# Patient Record
Sex: Female | Born: 1955 | Race: White | Hispanic: No | Smoking: Never smoker
Health system: Southern US, Community
[De-identification: ages and names within clinical notes are randomized; demographics above are authoritative.]

## PROBLEM LIST (undated history)

## (undated) DIAGNOSIS — F329 Major depressive disorder, single episode, unspecified: Secondary | ICD-10-CM

## (undated) DIAGNOSIS — K219 Gastro-esophageal reflux disease without esophagitis: Secondary | ICD-10-CM

## (undated) DIAGNOSIS — G4700 Insomnia, unspecified: Secondary | ICD-10-CM

## (undated) DIAGNOSIS — J329 Chronic sinusitis, unspecified: Secondary | ICD-10-CM

## (undated) DIAGNOSIS — F32A Depression, unspecified: Secondary | ICD-10-CM

## (undated) DIAGNOSIS — F419 Anxiety disorder, unspecified: Secondary | ICD-10-CM

## (undated) HISTORY — PX: KNEE ARTHROSCOPY: SUR90

## (undated) HISTORY — DX: Major depressive disorder, single episode, unspecified: F32.9

## (undated) HISTORY — DX: Gastro-esophageal reflux disease without esophagitis: K21.9

## (undated) HISTORY — DX: Anxiety disorder, unspecified: F41.9

## (undated) HISTORY — DX: Insomnia, unspecified: G47.00

## (undated) HISTORY — DX: Chronic sinusitis, unspecified: J32.9

## (undated) HISTORY — DX: Depression, unspecified: F32.A

---

## 2001-02-03 DIAGNOSIS — J329 Chronic sinusitis, unspecified: Secondary | ICD-10-CM

## 2001-02-03 HISTORY — DX: Chronic sinusitis, unspecified: J32.9

## 2006-03-20 ENCOUNTER — Ambulatory Visit: Payer: Self-pay | Admitting: Internal Medicine

## 2006-08-20 ENCOUNTER — Ambulatory Visit: Payer: Self-pay | Admitting: Internal Medicine

## 2009-02-03 HISTORY — PX: JOINT REPLACEMENT: SHX530

## 2009-03-06 HISTORY — PX: REPLACEMENT TOTAL KNEE: SUR1224

## 2009-05-18 ENCOUNTER — Ambulatory Visit: Payer: Self-pay | Admitting: Unknown Physician Specialty

## 2009-06-07 ENCOUNTER — Ambulatory Visit: Payer: Self-pay | Admitting: Internal Medicine

## 2010-04-08 ENCOUNTER — Ambulatory Visit: Payer: Self-pay | Admitting: Internal Medicine

## 2010-04-11 ENCOUNTER — Ambulatory Visit: Payer: Self-pay | Admitting: Internal Medicine

## 2010-10-01 ENCOUNTER — Encounter: Payer: Self-pay | Admitting: Internal Medicine

## 2010-10-01 ENCOUNTER — Ambulatory Visit (INDEPENDENT_AMBULATORY_CARE_PROVIDER_SITE_OTHER): Payer: PRIVATE HEALTH INSURANCE | Admitting: Internal Medicine

## 2010-10-01 VITALS — BP 120/83 | HR 80 | Temp 98.8°F | Resp 16 | Ht 63.0 in | Wt 185.0 lb

## 2010-10-01 DIAGNOSIS — M25579 Pain in unspecified ankle and joints of unspecified foot: Secondary | ICD-10-CM

## 2010-10-01 DIAGNOSIS — S96911A Strain of unspecified muscle and tendon at ankle and foot level, right foot, initial encounter: Secondary | ICD-10-CM

## 2010-10-01 DIAGNOSIS — S93409A Sprain of unspecified ligament of unspecified ankle, initial encounter: Secondary | ICD-10-CM

## 2010-10-01 MED ORDER — CELECOXIB 200 MG PO CAPS: 200.0000 mg | ORAL_CAPSULE | Freq: Every day | ORAL | Status: DC

## 2010-10-01 NOTE — Assessment & Plan Note (Addendum)
There is no history of trauma, so ligament sprain vs stress fracture are the most likely cauaes. Empiric treatment with NSAID.  celebrex samples given.   Referral to PT [facility] recommended as gthere is the concern that her ankles are strained secoddanry to alignemnt of leg following bialteral TKRs

## 2010-10-01 NOTE — Progress Notes (Signed)
  Subjective:    Patient ID: Michelle Ross, female    DOB: 11-10-1955, 55 y.o.   MRN: [id number]  HPI 55 yo Hispanic female with history of DJD s/p bilateral knee replacments presents with persistent bilateral knee pain and new onset right ankdle/dorsal foot pain status post surgery.  Pain in top of foot is aggravated by weight bearing .  NO history of falling or twisting.  No real treatment thus far.    Review of Systems  Constitutional: Negative for fever, chills and unexpected weight change.  HENT: Negative for hearing loss, ear pain, nosebleeds, congestion, sore throat, facial swelling, rhinorrhea, sneezing, mouth sores, trouble swallowing, neck pain, neck stiffness, voice change, postnasal drip, sinus pressure, tinnitus and ear discharge.   Eyes: Negative for pain, discharge, redness and visual disturbance.  Respiratory: Negative for cough, chest tightness, shortness of breath, wheezing and stridor.   Cardiovascular: Negative for chest pain, palpitations and leg swelling.  Musculoskeletal: Positive for back pain, joint swelling, arthralgias and gait problem. Negative for myalgias.  Skin: Negative for color change and rash.  Neurological: Negative for dizziness, weakness, light-headedness and headaches.  Hematological: Negative for adenopathy.       Objective:   Physical Exam  Constitutional: She is oriented to person, place, and time. She appears well-developed and well-nourished.  HENT:  Mouth/Throat: Oropharynx is clear and moist.  Eyes: EOM are normal. Pupils are equal, round, and reactive to light. No scleral icterus.  Neck: Normal range of motion. Neck supple. No JVD present. No thyromegaly present.  Cardiovascular: Normal rate, regular rhythm, normal heart sounds and intact distal pulses.   Pulmonary/Chest: Effort normal and breath sounds normal.  Abdominal: Soft. Bowel sounds are normal. She exhibits no mass. There is no tenderness.  Musculoskeletal: Normal range of motion.     Pain over the anterior ankle/dorsum of foot   Lymphadenopathy:    She has no cervical adenopathy.  Neurological: She is alert and oriented to person, place, and time.  Skin: Skin is warm and dry.  Psychiatric: She has a normal mood and affect.          Assessment & Plan:  Venous insufficiency: secodanty to surgery.  Compression stockings recommened.

## 2010-10-01 NOTE — Patient Instructions (Signed)
You can purchase knee high, thigh high or complete compression stockings to manage lower extremity swelling from a company called [employer] . [employer]  Go online and order their cataologue.  They are based on you shoe size and how strong you want them.    You can try Beano drops before a meal that causres gas or try Gas X or Mylanta Gas for bloating and gas after a meal.  They are available without a prescription.   We are going to try Celebrex 200 mg daily for inflammation.  Ice your ankle for 15 minutes after exercise and avoid high heeled shoes.

## 2010-10-16 ENCOUNTER — Encounter: Payer: Self-pay | Admitting: Internal Medicine

## 2011-02-10 ENCOUNTER — Other Ambulatory Visit: Payer: Self-pay | Admitting: *Deleted

## 2011-02-10 MED ORDER — HYDROCODONE-ACETAMINOPHEN 5-500 MG PO TABS: 1.0000 | ORAL_TABLET | Freq: Four times a day (QID) | ORAL | Status: AC | PRN

## 2011-02-10 NOTE — Telephone Encounter (Signed)
Ok to refill vicodin #30 1 refill

## 2011-02-10 NOTE — Telephone Encounter (Signed)
Faxed request from [facility], last filled 04/11/10.

## 2011-02-10 NOTE — Telephone Encounter (Signed)
Medicine called to pharmacy. 

## 2011-03-19 ENCOUNTER — Other Ambulatory Visit: Payer: Self-pay | Admitting: *Deleted

## 2011-03-19 MED ORDER — CITALOPRAM HYDROBROMIDE 40 MG PO TABS: 40.0000 mg | ORAL_TABLET | Freq: Every day | ORAL | Status: DC

## 2011-03-19 NOTE — Telephone Encounter (Signed)
Pt is asking that [facility] [place].

## 2011-03-21 ENCOUNTER — Ambulatory Visit (INDEPENDENT_AMBULATORY_CARE_PROVIDER_SITE_OTHER): Payer: PRIVATE HEALTH INSURANCE | Admitting: Internal Medicine

## 2011-03-21 ENCOUNTER — Telehealth: Payer: Self-pay | Admitting: *Deleted

## 2011-03-21 ENCOUNTER — Encounter: Payer: Self-pay | Admitting: Internal Medicine

## 2011-03-21 VITALS — BP 132/92 | HR 92 | Temp 98.2°F | Wt 194.0 lb

## 2011-03-21 DIAGNOSIS — J329 Chronic sinusitis, unspecified: Secondary | ICD-10-CM | POA: Insufficient documentation

## 2011-03-21 DIAGNOSIS — J383 Other diseases of vocal cords: Secondary | ICD-10-CM | POA: Insufficient documentation

## 2011-03-21 DIAGNOSIS — R030 Elevated blood-pressure reading, without diagnosis of hypertension: Secondary | ICD-10-CM

## 2011-03-21 DIAGNOSIS — E669 Obesity, unspecified: Secondary | ICD-10-CM

## 2011-03-21 DIAGNOSIS — R635 Abnormal weight gain: Secondary | ICD-10-CM

## 2011-03-21 MED ORDER — HYDROCODONE-ACETAMINOPHEN 5-500 MG PO TABS: 1.0000 | ORAL_TABLET | Freq: Four times a day (QID) | ORAL | Status: DC | PRN

## 2011-03-21 MED ORDER — AMOXICILLIN-POT CLAVULANATE 875-125 MG PO TABS: 1.0000 | ORAL_TABLET | Freq: Two times a day (BID) | ORAL | Status: AC

## 2011-03-21 NOTE — Telephone Encounter (Signed)
Pt is asking for work note for today.  She will pick up on Monday.

## 2011-03-21 NOTE — Progress Notes (Signed)
Subjective:    Patient ID: Michelle Ross, female    DOB: [birth year], 56 y.o.   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 For the last week she has been experiencing cough and sinus congestion and feels chest tightness at night when she lies down accompanied by a feeling of pressure and dyspnea. She has no history of  asthma, coronary artery disease or hyperlipidemia.  She does not smoke. She is not exercising regularly.  Past Medical History  Diagnosis Date  . GERD (gastroesophageal reflux disease)   . Sinusitis 2003    History  . Insomnia   . Depression   . Anxiety    Current Outpatient Prescriptions on File Prior to Visit  Medication Sig Dispense Refill  . citalopram (CELEXA) 40 MG tablet Take 1 tablet (40 mg total) by mouth daily.  30 tablet  5  . celecoxib (CELEBREX) 200 MG capsule Take 1 capsule (200 mg total) by mouth daily.  12 capsule  0     Review of Systems  Constitutional: Negative for fever, chills and unexpected weight change.  HENT: Positive for congestion and postnasal drip. Negative for hearing loss, ear pain, nosebleeds, sore throat, facial swelling, rhinorrhea, sneezing, mouth sores, trouble swallowing, neck pain, neck stiffness, voice change, sinus pressure, tinnitus and ear discharge.   Eyes: Negative for pain, discharge, redness and visual disturbance.  Respiratory: Positive for cough, chest tightness and wheezing. Negative for shortness of breath and stridor.   Cardiovascular: Negative for chest pain, palpitations and leg swelling.  Musculoskeletal: Negative for myalgias and arthralgias.  Skin: Negative for color change and rash.  Neurological: Negative for dizziness, weakness, light-headedness and headaches.  Hematological: Negative for adenopathy.   BP  132/92  Pulse 92  Temp(Src) 98.2 F (36.8 C) (Oral)  Wt 194 lb (87.998 kg)  SpO2 98%     Objective:   Physical Exam  Vitals reviewed. Constitutional: She is oriented to person, place, and time. She appears well-developed and well-nourished.  HENT:  Mouth/Throat: Oropharynx is clear and moist.  Eyes: EOM are normal. Pupils are equal, round, and reactive to light. No scleral icterus.  Neck: Normal range of motion. Neck supple. No JVD present. No thyromegaly present.  Cardiovascular: Normal rate, regular rhythm, normal heart sounds and intact distal pulses.   Pulmonary/Chest: Effort normal and breath sounds normal.  Abdominal: Soft. Bowel sounds are normal. She exhibits no mass. There is no tenderness.  Musculoskeletal: Normal range of motion. She exhibits no edema.  Lymphadenopathy:    She has no cervical adenopathy.  Neurological: She is alert and oriented to person, place, and time.  Skin: Skin is warm and dry.  Psychiatric: She has a normal mood and affect.          Assessment & Plan:   Sinusitis She is not wheezing on exam the sound she was making when she came in is explained to her with vocal cord dysfunction not wheezing. I will treat her for a sinus infection however with antibiotics and decongestants. If her symptoms continue or shortness of breath after her current upper respiratory infection is is resolved we will send her for pulmonary function testing cardiac stress testing  Obesity (BMI 30.0-34.9) She has gained considerable weight in the last 18 months. The ureters are her recent joint replacements which  have interrupted her prior history of running. Additionally she is unhappy at work and has very limited social outlets. I have recommended that she begin a low glycemic index diet and started her exercise regimen with a goal towards 30 minutes a day for 5 days per week. I have asked her to make a goal of losing 4 pounds a month and return to me in 3 months. We will  screen her for hyperlipidemia and diabetes when she can return for fasting lipids.  Elevated blood pressure reading without diagnosis of hypertension She has diastolic elevation today which is new for her. When she returns in 3 months if her pressure remains elevated we will begin medications.    Updated Medication List Outpatient Encounter Prescriptions as of 03/21/2011  Medication Sig Dispense Refill  . citalopram (CELEXA) 40 MG tablet Take 1 tablet (40 mg total) by mouth daily.  30 tablet  5  . HYDROcodone-acetaminophen (VICODIN) 5-500 MG per tablet Take 1 tablet by mouth every 6 (six) hours as needed for pain (or cough). Take one by mouth as needed.  30 tablet  2  . DISCONTD: HYDROcodone-acetaminophen (VICODIN) 5-500 MG per tablet Take one by mouth as needed.      Marland Kitchen amoxicillin-clavulanate (AUGMENTIN) 875-125 MG per tablet Take 1 tablet by mouth 2 (two) times daily.  14 tablet  0  . celecoxib (CELEBREX) 200 MG capsule Take 1 capsule (200 mg total) by mouth daily.  12 capsule  0

## 2011-03-21 NOTE — Patient Instructions (Signed)
Take augmentin for the sinus infection   Use Delsym for daytime cough,  vicodin for nighttime cough  Sudafed PE  Take 20 mg every 6 hours for congestion .   Use benadryl if needed for post nasal drainage

## 2011-03-21 NOTE — Telephone Encounter (Signed)
On printer

## 2011-03-21 NOTE — Telephone Encounter (Signed)
Note placed up front for patient to pick up

## 2011-03-23 ENCOUNTER — Encounter: Payer: Self-pay | Admitting: Internal Medicine

## 2011-03-23 DIAGNOSIS — R030 Elevated blood-pressure reading, without diagnosis of hypertension: Secondary | ICD-10-CM | POA: Insufficient documentation

## 2011-03-23 DIAGNOSIS — E669 Obesity, unspecified: Secondary | ICD-10-CM | POA: Insufficient documentation

## 2011-03-23 NOTE — Assessment & Plan Note (Signed)
She has gained considerable weight in the last 18 months. The ureters are her recent joint replacements which have interrupted her prior history of running. Additionally she is unhappy at work and has very limited social outlets. I have recommended that she begin a low glycemic index diet and started her exercise regimen with a goal towards 30 minutes a day for 5 days per week. I have asked her to make a goal of losing 4 pounds a month and return to me in 3 months. We will screen her for hyperlipidemia and diabetes when she can return for fasting lipids.

## 2011-03-23 NOTE — Assessment & Plan Note (Signed)
She has diastolic elevation today which is new for her. When she returns in 3 months if her pressure remains elevated we will begin medications.

## 2011-03-23 NOTE — Assessment & Plan Note (Signed)
She is not wheezing on exam the sound she was making when she came in is explained to her with vocal cord dysfunction not wheezing. I will treat her for a sinus infection however with antibiotics and decongestants. If her symptoms continue or shortness of breath after her current upper respiratory infection is is resolved we will send her for pulmonary function testing cardiac stress testing

## 2011-03-25 ENCOUNTER — Other Ambulatory Visit: Payer: PRIVATE HEALTH INSURANCE

## 2011-03-25 LAB — COMPREHENSIVE METABOLIC PANEL
Albumin: 4.1 g/dL (ref 3.5–5.2)
BUN: 13 mg/dL (ref 6–23)
CO2: 22 mEq/L (ref 19–32)
Calcium: 9.2 mg/dL (ref 8.4–10.5)
Chloride: 106 mEq/L (ref 96–112)
Glucose, Bld: 92 mg/dL (ref 70–99)
Potassium: 4 mEq/L (ref 3.5–5.1)

## 2011-03-25 LAB — LIPID PANEL
Cholesterol: 245 mg/dL — ABNORMAL HIGH (ref 0–200)
Triglycerides: 115 mg/dL (ref 0.0–149.0)

## 2011-03-25 LAB — LDL CHOLESTEROL, DIRECT: Direct LDL: 148.8 mg/dL

## 2011-03-25 NOTE — Progress Notes (Signed)
Quick Note:  All labs were normal, no signs of diabetes ______

## 2011-06-20 ENCOUNTER — Ambulatory Visit: Payer: PRIVATE HEALTH INSURANCE | Admitting: Internal Medicine

## 2011-07-04 ENCOUNTER — Ambulatory Visit (INDEPENDENT_AMBULATORY_CARE_PROVIDER_SITE_OTHER): Payer: PRIVATE HEALTH INSURANCE | Admitting: Internal Medicine

## 2011-07-04 ENCOUNTER — Encounter: Payer: Self-pay | Admitting: Internal Medicine

## 2011-07-04 VITALS — BP 122/70 | HR 78 | Temp 97.6°F | Resp 18 | Wt 187.5 lb

## 2011-07-04 DIAGNOSIS — R635 Abnormal weight gain: Secondary | ICD-10-CM

## 2011-07-04 DIAGNOSIS — E669 Obesity, unspecified: Secondary | ICD-10-CM

## 2011-07-04 MED ORDER — PHENTERMINE HCL 37.5 MG PO TABS: 37.5000 mg | ORAL_TABLET | Freq: Every day | ORAL | Status: DC

## 2011-07-04 MED ORDER — CITALOPRAM HYDROBROMIDE 40 MG PO TABS: 40.0000 mg | ORAL_TABLET | Freq: Every day | ORAL | Status: DC

## 2011-07-04 MED ORDER — PHENTERMINE HCL 37.5 MG PO TABS: 18.7500 mg | ORAL_TABLET | Freq: Every day | ORAL | Status: DC

## 2011-07-04 MED ORDER — SPIRONOLACTONE 25 MG PO TABS: 25.0000 mg | ORAL_TABLET | Freq: Every day | ORAL | Status: DC

## 2011-07-04 MED ORDER — HYDROCODONE-ACETAMINOPHEN 5-500 MG PO TABS: 1.0000 | ORAL_TABLET | Freq: Four times a day (QID) | ORAL | Status: DC | PRN

## 2011-07-04 NOTE — Patient Instructions (Signed)
We will try adding a diuretic for the fluid retention.  Spironolactone 25 mg daily    Consider the Low Glycemic Index Diet and 6 smaller meals daily .  This boosts your metabolism and regulates your sugars:   7 AM Low carbohydrate Protein  Shakes (EAS Carb Control  Or Atkins ,  Available everywhere,   In  cases at [employer] )  2.5 carbs  (Add or substitute a toasted sandwhich thin w/ peanut butter)  10 AM: Protein bar by Atkins (snack size,  Chocolate lover's variety at  [employer])    Lunch: sandwich on pita bread or flatbread (Joseph's makes a pita bread and a flat bread , available at [employer] and [employer]; Toufayah makes a low carb flatbread available at [employer] and [employer]) Mission makes a low carb whole wheat tortilla available at [employer] most grocery stores   3 PM:  Mid day :  Another protein bar,  Or a  cheese stick, 1/4 cup of almonds, walnuts, pistachios, pecans, peanuts,  Macadamia nuts  6 PM  Dinner:  "mean and green:"  Meat/chicken/fish, salad, and green veggie : use ranch, vinagrette,  Blue cheese, etc  9 PM snack : Breyer's low carb fudgsicle or  ice cream bar (Carb Smart), or  Weight Watcher's ice cream bar , or another protein shake

## 2011-07-06 NOTE — Progress Notes (Signed)
Patient ID: Michelle Ross, female   DOB: [birth year], 56 y.o.   MRN: [id number]  Patient Active Problem List  Diagnoses  . Pain in joint involving ankle and foot  . Weight gain  . Sinusitis  . Vocal cord dysfunction  . Obesity (BMI 30.0-34.9)  . Elevated blood pressure reading without diagnosis of hypertension    Subjective:  CC:   Chief Complaint  Patient presents with  . Follow-up    3 month    HPI:   Michelle Ross a 56 y.o. female who presents ollow up on weight gain.  She has lost 6 lbs since her lalst visit 2 months ago, but is frustrated at her inability to lose more despite regular exercise and a healthy diet that is low in starches.  She exercises daily for nearly an hour with a variety of activities and feels she has gained muscle mass but is still concerned that the weight gain is due partly to the weight of the hardware of both knees.     Past Medical History  Diagnosis Date  . GERD (gastroesophageal reflux disease)   . Sinusitis 2003    History  . Insomnia   . Depression   . Anxiety     Past Surgical History  Procedure Date  . Knee arthroscopy   . Replacement total knee Feb. 2011    Right Knee  . Joint replacement 2011    bilateral knee         The following portions of the patient's history were reviewed and updated as appropriate: Allergies, current medications, and problem list.    Review of Systems:   12 Pt  review of systems was negative except those addressed in the HPI.      History   Social History  . Marital Status: Divorced    Spouse Name: N/A    Number of Children: N/A  . Years of Education: N/A   Occupational History  . Intrepretor     Full-Time at [employer]   Social History Main Topics  . Smoking status: Never Smoker   . Smokeless tobacco: Never Used  . Alcohol Use: 3.0 oz/week    5 Glasses of wine per week     occasional  . Drug Use: No  . Sexually Active: Not on file   Other Topics Concern  . Not on  file   Social History Narrative   Exercises regularly    Objective:  BP 122/70  Pulse 78  Temp(Src) 97.6 F (36.4 C) (Oral)  Resp 18  Wt 187 lb 8 oz (85.049 kg)  SpO2 98%  General appearance: alert, cooperative and appears stated age Ears: normal TM's and external ear canals both ears Throat: lips, mucosa, and tongue normal; teeth and gums normal Neck: no adenopathy, no carotid bruit, supple, symmetrical, trachea midline and thyroid not enlarged, symmetric, no tenderness/mass/nodules Back: symmetric, no curvature. ROM normal. No CVA tenderness. Lungs: clear to auscultation bilaterally Heart: regular rate and rhythm, S1, S2 normal, no murmur, click, rub or gallop Abdomen: soft, non-tender; bowel sounds normal; no masses,  no organomegaly Pulses: 2+ and symmetric Skin: Skin color, texture, turgor normal. No rashes or lesions Lymph nodes: Cervical, supraclavicular, and axillary nodes normal.  Assessment and Plan:  Weight gain Encouragement given.  I have addressed  BMI and recommended a low glycemic index diet utilizing smaller more frequent meals to increase metabolism.    Spironolactone added for management of edema.     Updated Medication List Outpatient  Encounter Prescriptions as of 07/04/2011  Medication Sig Dispense Refill  . citalopram (CELEXA) 40 MG tablet Take 1 tablet (40 mg total) by mouth daily.  30 tablet  5  . HYDROcodone-acetaminophen (VICODIN) 5-500 MG per tablet Take 1 tablet by mouth every 6 (six) hours as needed for pain (or cough). Take one by mouth as needed.  30 tablet  2  . DISCONTD: citalopram (CELEXA) 40 MG tablet Take 1 tablet (40 mg total) by mouth daily.  30 tablet  5  . DISCONTD: HYDROcodone-acetaminophen (VICODIN) 5-500 MG per tablet Take 1 tablet by mouth every 6 (six) hours as needed for pain (or cough). Take one by mouth as needed.  30 tablet  2  . phentermine (ADIPEX-P) 37.5 MG tablet Take 0.5 tablets (18.75 mg total) by mouth daily before  breakfast.  15 tablet  2  . spironolactone (ALDACTONE) 25 MG tablet Take 1 tablet (25 mg total) by mouth daily.  30 tablet  1  . DISCONTD: celecoxib (CELEBREX) 200 MG capsule Take 1 capsule (200 mg total) by mouth daily.  12 capsule  0  . DISCONTD: phentermine (ADIPEX-P) 37.5 MG tablet Take 1 tablet (37.5 mg total) by mouth daily before breakfast.  30 tablet  2  . DISCONTD: phentermine (ADIPEX-P) 37.5 MG tablet Take 1 tablet (37.5 mg total) by mouth daily before breakfast.  15 tablet  2     No orders of the defined types were placed in this encounter.    No Follow-up on file.

## 2011-07-06 NOTE — Assessment & Plan Note (Addendum)
Encouragement given.  I have addressed  BMI and recommended a low glycemic index diet utilizing smaller more frequent meals to increase metabolism.    Spironolactone added for management of edema.

## 2011-07-22 ENCOUNTER — Telehealth: Payer: Self-pay | Admitting: Internal Medicine

## 2011-07-22 DIAGNOSIS — E669 Obesity, unspecified: Secondary | ICD-10-CM

## 2011-07-22 NOTE — Telephone Encounter (Signed)
Patient called and stated you told her to start out taking a half tablet of phentermine for one week then increase to one whole tablet.  She stated she was only prescribed 15 tablets and is now out of the medication.  Please advise on a refill.

## 2011-07-22 NOTE — Telephone Encounter (Signed)
Ok to refill the phentermine tablets 37.5 mg 1/2 tablet twice tomes daily before meals.  #30 1 refill

## 2011-07-23 MED ORDER — PHENTERMINE HCL 37.5 MG PO TABS: ORAL_TABLET | ORAL | Status: DC

## 2011-07-23 NOTE — Telephone Encounter (Signed)
rx called in

## 2011-08-18 ENCOUNTER — Other Ambulatory Visit: Payer: Self-pay | Admitting: *Deleted

## 2011-08-19 MED ORDER — HYDROCODONE-ACETAMINOPHEN 5-500 MG PO TABS: 1.0000 | ORAL_TABLET | Freq: Four times a day (QID) | ORAL | Status: DC | PRN

## 2011-08-20 NOTE — Telephone Encounter (Signed)
[facility]

## 2011-08-28 ENCOUNTER — Other Ambulatory Visit: Payer: Self-pay | Admitting: Internal Medicine

## 2011-10-03 ENCOUNTER — Ambulatory Visit: Payer: PRIVATE HEALTH INSURANCE | Admitting: Internal Medicine

## 2011-10-15 LAB — HM PAP SMEAR: HM Pap smear: NORMAL

## 2011-10-28 ENCOUNTER — Ambulatory Visit (INDEPENDENT_AMBULATORY_CARE_PROVIDER_SITE_OTHER): Payer: PRIVATE HEALTH INSURANCE | Admitting: Internal Medicine

## 2011-10-28 ENCOUNTER — Encounter: Payer: Self-pay | Admitting: Internal Medicine

## 2011-10-28 ENCOUNTER — Other Ambulatory Visit (HOSPITAL_COMMUNITY)
Admission: RE | Admit: 2011-10-28 | Discharge: 2011-10-28 | Disposition: A | Discharge status: Home / Self Care | Payer: PRIVATE HEALTH INSURANCE | Source: Ambulatory Visit | Attending: Internal Medicine | Admitting: Internal Medicine

## 2011-10-28 VITALS — BP 124/70 | HR 88 | Temp 98.2°F | Resp 16 | Wt 172.2 lb

## 2011-10-28 DIAGNOSIS — Z124 Encounter for screening for malignant neoplasm of cervix: Secondary | ICD-10-CM

## 2011-10-28 DIAGNOSIS — Z1239 Encounter for other screening for malignant neoplasm of breast: Secondary | ICD-10-CM

## 2011-10-28 DIAGNOSIS — N8111 Cystocele, midline: Secondary | ICD-10-CM

## 2011-10-28 DIAGNOSIS — Z1211 Encounter for screening for malignant neoplasm of colon: Secondary | ICD-10-CM

## 2011-10-28 DIAGNOSIS — Z0000 Encounter for general adult medical examination without abnormal findings: Secondary | ICD-10-CM

## 2011-10-28 DIAGNOSIS — Z01419 Encounter for gynecological examination (general) (routine) without abnormal findings: Secondary | ICD-10-CM | POA: Insufficient documentation

## 2011-10-28 DIAGNOSIS — Z1151 Encounter for screening for human papillomavirus (HPV): Secondary | ICD-10-CM | POA: Insufficient documentation

## 2011-10-28 DIAGNOSIS — IMO0002 Reserved for concepts with insufficient information to code with codable children: Secondary | ICD-10-CM | POA: Insufficient documentation

## 2011-10-28 NOTE — Patient Instructions (Addendum)
I am referring you to [facility] for you colonoscopy and your mammogram.  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

## 2011-10-28 NOTE — Progress Notes (Signed)
Patient ID: Michelle Ross, female   DOB: 02-01-1956, 56 y.o.   MRN: [id number]  Subjective:    Michelle Ross is a 56 y.o. female who presents for an annual exam. The patient has no complaints today. The patient is not sexually active. GYN screening history: last pap: was normal and approximate date 2011 and was normal. The patient wears seatbelts: yes. The patient participates in regular exercise: yes. Has the patient ever been transfused or tattooed?: no. The patient reports that there is not domestic violence in her life.   Menstrual History: OB History    Grav Para Term Preterm Abortions TAB SAB Ect Mult Living                  Menarche age: 72 No LMP recorded. Patient is postmenopausal.    The following portions of the patient's history were reviewed and updated as appropriate: allergies, current medications, past family history, past medical history, past social history, past surgical history and problem list.  Review of Systems A comprehensive review of systems was negative.    Objective:    BP 124/70  Pulse 88  Temp 98.2 F (36.8 C) (Oral)  Resp 16  Wt 172 lb 4 oz (78.132 kg)  SpO2 96%  General Appearance:    Alert, cooperative, no distress, appears stated age  Head:    Normocephalic, without obvious abnormality, atraumatic  Eyes:    PERRL, conjunctiva/corneas clear, EOM's intact, fundi    benign, both eyes  Ears:    Normal TM's and external ear canals, both ears  Nose:   Nares normal, septum midline, mucosa normal, no drainage    or sinus tenderness  Throat:   Lips, mucosa, and tongue normal; teeth and gums normal  Neck:   Supple, symmetrical, trachea midline, no adenopathy;    thyroid:  no enlargement/tenderness/nodules; no carotid   bruit or JVD  Back:     Symmetric, no curvature, ROM normal, no CVA tenderness  Lungs:     Clear to auscultation bilaterally, respirations unlabored  Chest Wall:    No tenderness or deformity   Heart:    Regular rate and rhythm, S1 and S2  normal, no murmur, rub   or gallop  Breast Exam:    No tenderness, masses, or nipple abnormality  Abdomen:     Soft, non-tender, bowel sounds active all four quadrants,    no masses, no organomegaly  Genitalia:    Normal female without lesion, discharge or tenderness  Rectal:    Normal tone, normal prostate, no masses or tenderness;   guaiac negative stool  Extremities:   Extremities normal, atraumatic, no cyanosis or edema  Pulses:   2+ and symmetric all extremities  Skin:   Skin color, texture, turgor normal, no rashes or lesions  Lymph nodes:   Cervical, supraclavicular, and axillary nodes normal  Neurologic:   CNII-XII intact, normal strength, sensation and reflexes    throughout  .    Assessment:    Healthy female exam.    Plan:     Await pap smear results. Mammogram.

## 2011-12-01 ENCOUNTER — Other Ambulatory Visit: Payer: Self-pay | Admitting: Internal Medicine

## 2011-12-01 MED ORDER — HYDROCODONE-ACETAMINOPHEN 5-500 MG PO TABS: 1.0000 | ORAL_TABLET | Freq: Four times a day (QID) | ORAL | Status: DC | PRN

## 2011-12-01 NOTE — Telephone Encounter (Signed)
Pt called stated she [facility] [place] [facility] and pt still doesn't have her rx for  vicidin

## 2011-12-02 NOTE — Telephone Encounter (Signed)
[facility]

## 2011-12-18 ENCOUNTER — Encounter: Payer: Self-pay | Admitting: Internal Medicine

## 2012-02-27 ENCOUNTER — Other Ambulatory Visit: Payer: Self-pay | Admitting: Internal Medicine

## 2012-02-27 ENCOUNTER — Telehealth: Payer: Self-pay | Admitting: Internal Medicine

## 2012-02-27 MED ORDER — HYDROCODONE-ACETAMINOPHEN 5-500 MG PO TABS: 1.0000 | ORAL_TABLET | Freq: Four times a day (QID) | ORAL | Status: DC | PRN

## 2012-02-27 NOTE — Telephone Encounter (Signed)
Hydrocodon-Acetaminophen 5-500  Take 1 tablet by mouth every 6 hours as needed for pain

## 2012-03-03 ENCOUNTER — Telehealth: Payer: Self-pay | Admitting: General Practice

## 2012-03-03 MED ORDER — AMOXICILLIN-POT CLAVULANATE 250-62.5 MG/5ML PO SUSR: ORAL | Status: DC

## 2012-03-03 NOTE — Telephone Encounter (Signed)
Liquid amoxicillin sent to pharmacy .  Typically taken 1 hour prior to procedure but she will have to abide by the instructions given to her by the GI doctor regarding when she have her last liquid prior to the procedure.

## 2012-03-03 NOTE — Telephone Encounter (Signed)
Pt called wanting to know if she needed any medications for her colonoscopy on Friday. Pt would prefer a liquid antibiotic if possible due to knee replacement. [facility] [place]  Please advise.

## 2012-03-04 NOTE — Telephone Encounter (Signed)
Pt.notified

## 2012-04-07 ENCOUNTER — Encounter: Payer: Self-pay | Admitting: Internal Medicine

## 2012-05-24 ENCOUNTER — Encounter: Payer: Self-pay | Admitting: *Deleted

## 2012-05-24 ENCOUNTER — Other Ambulatory Visit: Payer: Self-pay | Admitting: *Deleted

## 2012-05-24 MED ORDER — HYDROCODONE-ACETAMINOPHEN 5-500 MG PO TABS: 1.0000 | ORAL_TABLET | Freq: Four times a day (QID) | ORAL | Status: DC | PRN

## 2012-05-24 NOTE — Telephone Encounter (Signed)
Last filled 04/23/12

## 2012-05-24 NOTE — Telephone Encounter (Signed)
Ok to refill,  Authorized in epic 

## 2012-07-26 ENCOUNTER — Telehealth: Payer: Self-pay | Admitting: *Deleted

## 2012-07-26 NOTE — Telephone Encounter (Signed)
Patient called and stated she is feeling depressed due to child going away to college, and would like a refill on Lexapro 40 mg ,  Patient last OV 02/27/12 please advise.

## 2012-07-27 MED ORDER — ESCITALOPRAM OXALATE 10 MG PO TABS: 10.0000 mg | ORAL_TABLET | Freq: Every day | ORAL | Status: DC

## 2012-07-27 NOTE — Telephone Encounter (Signed)
resume starting with 10 mg daily  ,  Not 40 mg  , we can increase as needed. Schedule follow up ,. Last visit 9/13

## 2012-07-29 ENCOUNTER — Encounter: Payer: Self-pay | Admitting: Internal Medicine

## 2012-08-02 ENCOUNTER — Telehealth: Payer: Self-pay | Admitting: Internal Medicine

## 2012-08-02 MED ORDER — CITALOPRAM HYDROBROMIDE 20 MG PO TABS: 20.0000 mg | ORAL_TABLET | Freq: Every day | ORAL | Status: DC

## 2012-08-02 NOTE — Telephone Encounter (Signed)
Citalopram 20 mg, yesterday took last pill.  Was recently prescribed this in [place].  Pt states she was also prescribed this medication by Dr. Darrick Huntsman a long time ago but had stopped taking it.  Would like Dr. Darrick Huntsman to continue this medication for her and needs a refill asap.

## 2012-08-02 NOTE — Telephone Encounter (Signed)
Left message for patient to call office.  

## 2012-08-02 NOTE — Addendum Note (Signed)
Addended by: Sherlene Shams on: 08/02/2012 05:10 PM   Modules accepted: Orders, Medications

## 2012-08-02 NOTE — Telephone Encounter (Signed)
90 days supply of citalopram sent to her pharmacy

## 2012-08-16 ENCOUNTER — Ambulatory Visit (INDEPENDENT_AMBULATORY_CARE_PROVIDER_SITE_OTHER): Payer: PRIVATE HEALTH INSURANCE | Admitting: Internal Medicine

## 2012-08-16 ENCOUNTER — Encounter: Payer: Self-pay | Admitting: Internal Medicine

## 2012-08-16 VITALS — BP 120/74 | HR 76 | Temp 98.6°F | Resp 14 | Wt 189.5 lb

## 2012-08-16 DIAGNOSIS — E669 Obesity, unspecified: Secondary | ICD-10-CM

## 2012-08-16 DIAGNOSIS — E66811 Obesity, class 1: Secondary | ICD-10-CM

## 2012-08-16 DIAGNOSIS — F411 Generalized anxiety disorder: Secondary | ICD-10-CM

## 2012-08-16 DIAGNOSIS — M25579 Pain in unspecified ankle and joints of unspecified foot: Secondary | ICD-10-CM

## 2012-08-16 MED ORDER — HYDROCODONE-ACETAMINOPHEN 5-325 MG PO TABS: 1.0000 | ORAL_TABLET | Freq: Every day | ORAL | Status: DC | PRN

## 2012-08-16 NOTE — Patient Instructions (Addendum)
Please ask your sister to find the alternate name of Tassil , the medication you received in [place].  I agree with reducing your vicodin use to not more than once daily  I would like you to lose 19 lbs over the next 4 to 6 months

## 2012-08-16 NOTE — Telephone Encounter (Signed)
Patient notified in office.

## 2012-08-16 NOTE — Assessment & Plan Note (Addendum)
Has been taking a prescribed anxiolytic from a [place] MD that I do not recognize that is called only " Tassi."  Did not bring the actual bottle back from [place].  Taking 10 mg at night .  Cannot refil without more information.  Continue SSRI

## 2012-08-16 NOTE — Patient Instructions (Signed)
Please ask your sister to find the alternate name of Tassil , the medication you received in [place].  I agree with reducing your vicodin use to not more than once daily  I would like you to lose 19 lbs over the next 4 to 6 months using a low glycemic index diet and regular aerobic exercise.

## 2012-08-16 NOTE — Progress Notes (Signed)
Patient ID: Michelle Ross, female   DOB: 05-06-1955, 57 y.o.   MRN: [id number]   Patient Active Problem List   Diagnosis Date Noted  . Anxiety state, unspecified 08/16/2012  . Cystocele 10/28/2011  . Routine general medical examination at a health care facility 10/28/2011  . Obesity (BMI 30.0-34.9) 03/23/2011  . Elevated blood pressure reading without diagnosis of hypertension 03/23/2011  . Weight gain 03/21/2011  . Sinusitis 03/21/2011  . Pain in joint involving ankle and foot 10/01/2010    Subjective:  CC:   Chief Complaint  Patient presents with  . Follow-up    HPI:   Michelle Ross a 58 y.o. female who presentsfor follow up on depression, joint pain and obesity.  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  Including, at times nurse and provider .   She recently returned to [place] for a wedding, and became very depressed and anxious while there. Had an episode of persistent numbness over left parietal scalp followed by sensation of coldness in neck and left arm accompanied by panic and tachycardia.   Saw an alternative medicine doc who took a hair sample and prescribed some homeopathic medication along with an anxiolytic . No prior histoyr of panic attack. symptoms have resolved but she remains very saddened by the very recent departure of her 26 yr old daughter who has left  Study abroad in [place] for 9 months .  She cannot speak of her without crying .  She is worried about her daughter's safety beeven though she will be staying with a family and studyin gat the [employer]. She feels "lost" without daughter here, and acknowledges that the only reason she moved to the [place] from [place] was for her daughter's education , and now that her daughter is in [place] for an extended period she feels compelled to reevaluated her own life plan .   2)Obesity and depression:  She has had a weight  gain of nearly 30 lbs since her knee replacements 12 lb since last visit. BMI is now 33.  She has made an effort to get daily exercise and eat regularly although she often tends to skip meals.  Doesn't want to socialize.   She does not "feel" or consider herself obese,  Has a healthy self esteem and body image. Prior to her knee replacements she maintained a normal BMI by running.    Past Medical History  Diagnosis Date  . GERD (gastroesophageal reflux disease)   . Sinusitis 2003    History  . Insomnia   . Depression   . Anxiety     Past Surgical History  Procedure Laterality Date  . Knee arthroscopy    . 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 Joint replacement  2011    bilateral knee       The following portions of the patient's history were reviewed and updated as appropriate: Allergies, current medications, and problem list.    Review of Systems:   12 Pt  review of systems was negative except those addressed in the HPI,     History   Social History  . Marital Status: Divorced    Spouse Name: N/A    Number of Children: N/A  . Years of Education: N/A   Occupational History  . Intrepretor     Full-Time at [employer]   Social History Main Topics  .  Smoking status: Never Smoker   . Smokeless tobacco: Never Used  . Alcohol Use: 3.0 oz/week    5 Glasses of wine per week     Comment: occasional  . Drug Use: No  . Sexually Active: Not on file   Other Topics Concern  . Not on file   Social History Narrative   Exercises regularly             Objective:  BP 120/74  Pulse 76  Temp(Src) 98.6 F (37 C) (Oral)  Resp 14  Wt 189 lb 8 oz (85.957 kg)  BMI 33.58 kg/m2  SpO2 97%  General appearance: alert, cooperative and appears stated age Ears: normal TM's and external ear canals both ears Throat: lips, mucosa, and tongue normal; teeth and gums normal Neck: no adenopathy, no carotid bruit, supple, symmetrical, trachea midline and  thyroid not enlarged, symmetric, no tenderness/mass/nodules Back: symmetric, no curvature. ROM normal. No CVA tenderness. Lungs: clear to auscultation bilaterally Heart: regular rate and rhythm, S1, S2 normal, no murmur, click, rub or gallop Abdomen: soft, non-tender; bowel sounds normal; no masses,  no organomegaly Pulses: 2+ and symmetric Skin: Skin color, texture, turgor normal. No rashes or lesions Lymph nodes: Cervical, supraclavicular, and axillary nodes normal.  Assessment and Plan:  Anxiety state, unspecified Has been taking a prescribed anxiolytic from a [place] MD that I do not recognize that is called only " Tassi."  Did not bring the actual bottle back from [place].  Taking 10 mg at night .  Cannot refil without more information.  Continue SSRI  Obesity (BMI 30.0-34.9) I have addressed  BMI and recommended a low glycemic index diet utilizing smaller more frequent meals to increase metabolism.  I have also recommended that patient start exercising with a goal of 30 minutes of aerobic exercise a minimum of 5 days per week. Screening for lipid disorders, thyroid and diabetes will be repeated prior to her next annual PE  Pain in joint involving ankle and foot She has reduced her use of vicodin to half tablet daily prior to exercise.     Updated Medication List Outpatient Encounter Prescriptions as of 08/16/2012  Medication Sig Dispense Refill  . citalopram (CELEXA) 20 MG tablet Take 1 tablet (20 mg total) by mouth daily.  90 tablet  1  . [DISCONTINUED] HYDROcodone-acetaminophen (VICODIN) 5-500 MG per tablet Take 1 tablet by mouth every 6 (six) hours as needed for pain (or cough). Take one by mouth as needed.  30 tablet  2  . HYDROcodone-acetaminophen (NORCO/VICODIN) 5-325 MG per tablet Take 1 tablet by mouth daily as needed for pain.  30 tablet  5  . [DISCONTINUED] amoxicillin-clavulanate (AUGMENTIN) 250-62.5 MG/5ML suspension 40 ml dose to be taken the morning of her colonoscopy  40  mL  0   No facility-administered encounter medications on file as of 08/16/2012.     No orders of the defined types were placed in this encounter.    No Follow-up on file.

## 2012-08-17 ENCOUNTER — Encounter: Payer: Self-pay | Admitting: Internal Medicine

## 2012-08-17 NOTE — Assessment & Plan Note (Addendum)
I have addressed  BMI and recommended a low glycemic index diet utilizing smaller more frequent meals to increase metabolism.  I have also recommended that patient start exercising with a goal of 30 minutes of aerobic exercise a minimum of 5 days per week. Screening for lipid disorders, thyroid and diabetes will be repeated prior to her next annual PE

## 2012-08-17 NOTE — Assessment & Plan Note (Signed)
She has reduced her use of vicodin to half tablet daily prior to exercise.

## 2012-10-06 ENCOUNTER — Telehealth: Payer: Self-pay | Admitting: Internal Medicine

## 2012-10-06 NOTE — Telephone Encounter (Signed)
The patient fell in the house and because she has had knee replacement surgery before she is needing this medication for pain. The patient has been checked out .   HYDROcodone-acetaminophen (NORCO/VICODIN) 5-325 MG per

## 2012-10-08 MED ORDER — HYDROCODONE-ACETAMINOPHEN 5-325 MG PO TABS: 1.0000 | ORAL_TABLET | Freq: Every day | ORAL | Status: DC | PRN

## 2012-10-08 NOTE — Telephone Encounter (Signed)
Ok to refill,  Authorized in epic 

## 2012-10-08 NOTE — Telephone Encounter (Signed)
Rx called into pharamcy.  Attempted to call patient but unable to reach

## 2012-10-13 ENCOUNTER — Encounter: Payer: Self-pay | Admitting: *Deleted

## 2012-10-14 ENCOUNTER — Encounter: Payer: Self-pay | Admitting: Internal Medicine

## 2012-10-14 ENCOUNTER — Ambulatory Visit (INDEPENDENT_AMBULATORY_CARE_PROVIDER_SITE_OTHER): Payer: PRIVATE HEALTH INSURANCE | Admitting: Internal Medicine

## 2012-10-14 VITALS — BP 128/82 | HR 72 | Temp 98.7°F | Resp 14 | Ht 63.0 in | Wt 183.5 lb

## 2012-10-14 DIAGNOSIS — R030 Elevated blood-pressure reading, without diagnosis of hypertension: Secondary | ICD-10-CM

## 2012-10-14 DIAGNOSIS — E785 Hyperlipidemia, unspecified: Secondary | ICD-10-CM

## 2012-10-14 DIAGNOSIS — R5381 Other malaise: Secondary | ICD-10-CM

## 2012-10-14 DIAGNOSIS — F411 Generalized anxiety disorder: Secondary | ICD-10-CM

## 2012-10-14 DIAGNOSIS — Z0000 Encounter for general adult medical examination without abnormal findings: Secondary | ICD-10-CM

## 2012-10-14 DIAGNOSIS — E669 Obesity, unspecified: Secondary | ICD-10-CM

## 2012-10-14 DIAGNOSIS — E559 Vitamin D deficiency, unspecified: Secondary | ICD-10-CM

## 2012-10-14 LAB — COMPREHENSIVE METABOLIC PANEL
ALT: 20 U/L (ref 0–35)
CO2: 28 mEq/L (ref 19–32)
Calcium: 9.2 mg/dL (ref 8.4–10.5)
Chloride: 104 mEq/L (ref 96–112)
Creatinine, Ser: 0.9 mg/dL (ref 0.4–1.2)
GFR: 71.08 mL/min (ref >60.00)
Glucose, Bld: 97 mg/dL (ref 70–99)
Total Bilirubin: 0.5 mg/dL (ref 0.3–1.2)

## 2012-10-14 LAB — LDL CHOLESTEROL, DIRECT: Direct LDL: 151.5 mg/dL

## 2012-10-14 LAB — LIPID PANEL
Cholesterol: 230 mg/dL — ABNORMAL HIGH (ref 0–200)
Triglycerides: 105 mg/dL (ref 0.0–149.0)

## 2012-10-14 LAB — CBC WITH DIFFERENTIAL/PLATELET
Basophils Relative: 0.6 % (ref 0.0–3.0)
Eosinophils Absolute: 0.1 10*3/uL (ref 0.0–0.7)
Eosinophils Relative: 1.8 % (ref 0.0–5.0)
Lymphocytes Relative: 30.5 % (ref 12.0–46.0)
MCHC: 33.3 g/dL (ref 30.0–36.0)
MCV: 91.1 fl (ref 78.0–100.0)
Monocytes Absolute: 0.5 10*3/uL (ref 0.1–1.0)
Neutrophils Relative %: 59.7 % (ref 43.0–77.0)
Platelets: 244 10*3/uL (ref 150.0–400.0)
RBC: 4.49 Mil/uL (ref 3.87–5.11)
WBC: 7.3 10*3/uL (ref 4.5–10.5)

## 2012-10-14 LAB — TSH: TSH: 1.73 u[IU]/mL (ref 0.35–5.50)

## 2012-10-14 MED ORDER — ZOLPIDEM TARTRATE ER 12.5 MG PO TBCR: 12.5000 mg | EXTENDED_RELEASE_TABLET | Freq: Every evening | ORAL | Status: DC | PRN

## 2012-10-14 NOTE — Progress Notes (Signed)
Patient ID: LENZIE MONTESANO, female   DOB: March 01, 1955, 57 y.o.   MRN: [id number]   Subjective:     ARIS MOMAN is a 57 y.o. female and is here for a comprehensive physical exam. The patient reports problems - with mood.   She is tearful today.   Work stressors worse.  Feels trapped by current position.  Salary is apparently maxed out at $29K,  Told she is overqualified for other positions she has sought out at other health care facilities .  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 Feel she is being pushed out because of her age,  Being asked to do jobs she is not trained for with the implication that she can leave. Feels the patients are being treated roughly by some of the providers, and when she reports her observations she is shunned by the staff and reprimanded by her supervisors. Her ideas are not considered at meetings. Has prior training as a respiratory therapist as well.  She is tearful, anxious, feels socially isolated from family .  Daughter is studying abroad in [place],  Parents are in [place].  Has withdrawn from social circle,  Cites disappointment with the morals of her former friends as reason she prefers to stay home at night.  Exercising regularly but unable to lose weight.    History   Social History  . Marital Status: Divorced    Spouse Name: N/A    Number of Children: N/A  . Years of Education: N/A   Occupational History  . Intrepretor     Full-Time at [employer]   Social History Main Topics  . Smoking status: Never Smoker   . Smokeless tobacco: Never Used  . Alcohol Use: 3.0 oz/week    5 Glasses of wine per week     Comment: occasional  . Drug Use: No  . Sexual Activity: Not on file   Other Topics Concern  . Not on file   Social History Narrative   Exercises regularly            Health Maintenance  Topic Date Due  . Influenza Vaccine  09/03/2012  . Mammogram  11/06/2013  . Pap Smear   10/28/2014  . Tetanus/tdap  10/14/2016  . Colonoscopy  03/05/2022    The following portions of the patient's history were reviewed and updated as appropriate: allergies, current medications, past family history, past medical history, past social history, past surgical history and problem list.  Review of Systems A comprehensive review of systems was negative except for: Behavioral/Psych: positive for depression   Objective:   BP 128/82  Pulse 72  Temp(Src) 98.7 F (37.1 C) (Oral)  Resp 14  Ht 5\' 3"  (1.6 m)  Wt 183 lb 8 oz (83.235 kg)  BMI 32.51 kg/m2  SpO2 98%  General Appearance:    Alert, cooperative, emotionally distressed,  appears stated age  Head:    Normocephalic, without obvious abnormality, atraumatic  Eyes:    PERRL, conjunctiva/corneas clear, EOM's intact, fundi    benign, both eyes  Ears:    Normal TM's and external ear canals, both ears  Nose:   Nares normal, septum midline, mucosa normal, no drainage    or sinus tenderness  Throat:   Lips, mucosa, and tongue normal; teeth and gums normal  Neck:   Supple, symmetrical, trachea midline, no adenopathy;    thyroid:  no enlargement/tenderness/nodules; no carotid   bruit or JVD  Back:  Symmetric, no curvature, ROM normal, no CVA tenderness  Lungs:     Clear to auscultation bilaterally, respirations unlabored  Chest Wall:    No tenderness or deformity   Heart:    Regular rate and rhythm, S1 and S2 normal, no murmur, rub   or gallop  Breast Exam:    No tenderness, masses, or nipple abnormality  Abdomen:     Soft, non-tender, bowel sounds active all four quadrants,    no masses, no organomegaly  Genitalia:    deferred  Extremities:   Extremities normal, bilaterally TKR scars, no cyanosis or edema  Pulses:   2+ and symmetric all extremities  Skin:   Skin color, texture, turgor normal, no rashes or lesions  Lymph nodes:   Cervical, supraclavicular, and axillary nodes normal  Neurologic:   CNII-XII intact, normal  strength, sensation and reflexes    throughout     Assessment:   Anxiety state, unspecified Secondary to dysfunction and inequalities 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 Spent 30 minutes discussing her siutation at work and how she is handling the adversarial environment.    Routine general medical examination at a health care facility Annual comprehensive exam was done including breast, excluding pelvic exam . All screenings have been addressed .   Obesity (BMI 30.0-34.9) I have addressed  BMI and recommended a low glycemic index diet utilizing smaller more frequent meals to increase metabolism.  I have also recommended that patient start exercising with a goal of 30 minutes of aerobic exercise a minimum of 5 days per week. Screening for lipid disorders, thyroid and diabetes to be done today.    Elevated blood pressure reading without diagnosis of hypertension Readings have been normal for the last year.    Updated Medication List Outpatient Encounter Prescriptions as of 10/14/2012  Medication Sig Dispense Refill  . citalopram (CELEXA) 20 MG tablet Take 1 tablet (20 mg total) by mouth daily.  90 tablet  1  . HYDROcodone-acetaminophen (NORCO/VICODIN) 5-325 MG per tablet Take 1 tablet by mouth daily as needed for pain.  90 tablet  0  . zolpidem (AMBIEN CR) 12.5 MG CR tablet Take 1 tablet (12.5 mg total) by mouth at bedtime as needed for sleep.  30 tablet  3   No facility-administered encounter medications on file as of 10/14/2012.

## 2012-10-14 NOTE — Patient Instructions (Addendum)
We will order your mammogram today and call you with the appt  Labs today  Trial of gerneric ambien CR (controlled release) to help you sleep  Consider exercsising in the morning before work  You should investigate job opportunities with [employer]     All of the foods can be found at grocery stores and in bulk at [employer].  The Atkins protein bars and shakes are available in more varieties at Target, WalMart and Lowe's Foods.     7 AM Breakfast:  Choose from the following:  Low carbohydrate Protein  Shakes (I recommend the [employer]AS AdvantEdge "Carb Control" shakes  Or the low carb shakes by Atkins.    2.5 carbs   Arnold's "Sandwhich Thin"toasted  w/ peanut butter (no jelly: about 20 net carbs  "Bagel Thin" with cream cheese and salmon: about 20 carbs   a scrambled egg/bacon/cheese burrito made with Mission's "carb balance" whole wheat tortilla  (about 10 net carbs )   Avoid cereal and bananas, oatmeal and cream of wheat and grits. They are loaded with carbohydrates!   10 AM: high protein snack  Protein bar by Atkins (the snack size, under 200 cal, usually < 6 net carbs).    A stick of cheese:  Around 1 carb,  100 cal     Dannon Light n Fit [place] Yogurt  (80 cal, 8 carbs)  Other so called "protein bars" and Greek yogurts tend to be loaded with carbohydrates.  Remember, in food advertising, the word "energy" is synonymous for " carbohydrate."  Lunch:   A Sandwich using the bread choices listed, Can use any  Eggs,  lunchmeat, grilled meat or canned tuna), avocado, regular mayo/mustard  and cheese.  A Salad using blue cheese, ranch,  Goddess or vinagrette,  No croutons or "confetti" and no "candied nuts" but regular nuts OK.   No pretzels or chips.  Pickles and miniature sweet peppers are a good low carb alternative that provide a "crunch"  The bread is the only source of carbohydrate in a sandwich and  can be decreased by trying some of these alternatives to traditional loaf  bread  Joseph's makes a pita bread and a flat bread that are 50 cal and 4 net carbs available at BJs and WalMart.  This can be toasted to use with hummous as well  Toufayan makes a low carb flatbread that's 100 cal and 9 net carbs available at [employer] and [employer] makes 2 sizes of  Low carb whole wheat tortilla  (The large one is 210 cal and 6 net carbs) Avoid "Low fat dressings, as well as Reyne Dumas and [address] dressings They are loaded with sugar!   3 PM/ Mid day  Snack:  Consider  1 ounce of  almonds, walnuts, pistachios, pecans, peanuts,  Macadamia nuts or a nut medley.  Avoid "granola"; the dried cranberries and raisins are loaded with carbohydrates. Mixed nuts as long as there are no raisins,  cranberries or dried fruit.     6 PM  Dinner:     Meat/fowl/fish with a green salad, and either broccoli, cauliflower, green beans, spinach, brussel sprouts or  Lima beans. DO NOT BREAD THE PROTEIN!!      There is a low carb pasta by Dreamfield's that is acceptable and tastes great: only 5 digestible carbs/serving.( All grocery stores but BJs carry it )  Try Kai Levins Angelo's chicken piccata or chicken or eggplant parm over low carb pasta.(Lowes and BJs)  Clifton Custard Sanchez's "Carnitas" (pulled pork, no sauce,  0 carbs) or his beef pot roast to make a dinner burrito (at BJ's)  Pesto over low carb pasta (bj's sells a good quality pesto in the center refrigerated section of the deli   Whole wheat pasta is still full of digestible carbs and  Not as low in glycemic index as Dreamfield's.   Brown rice is still rice,  So skip the rice and noodles if you eat [place] or [place] (or at least limit to 1/2 cup)  9 PM snack :   Breyer's "low carb" fudgsicle or  ice cream bar (Carb Smart line), or  Weight Watcher's ice cream bar , or another "no sugar added" ice cream;  a serving of fresh berries/cherries with whipped cream   Cheese or DANNON'S LlGHT N FIT GREEK YOGURT  Avoid bananas, pineapple,  grapes  and watermelon on a regular basis because they are high in sugar.  THINK OF THEM AS DESSERT  Remember that snack Substitutions should be less than 10 NET carbs per serving and meals < 20 carbs. Remember to subtract fiber grams to get the "net carbs."

## 2012-10-14 NOTE — Assessment & Plan Note (Addendum)
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 Spent 30 minutes discussing her siutation at work and how she is handling the adversarial environment.

## 2012-10-15 LAB — VITAMIN D 25 HYDROXY (VIT D DEFICIENCY, FRACTURES): Vit D, 25-Hydroxy: 27 ng/mL — ABNORMAL LOW (ref 30–89)

## 2012-10-16 NOTE — Assessment & Plan Note (Signed)
I have addressed  BMI and recommended a low glycemic index diet utilizing smaller more frequent meals to increase metabolism.  I have also recommended that patient start exercising with a goal of 30 minutes of aerobic exercise a minimum of 5 days per week. Screening for lipid disorders, thyroid and diabetes to be done today.   

## 2012-10-16 NOTE — Assessment & Plan Note (Signed)
Annual comprehensive exam was done including breast, excluding pelvicexam. All screenings have been addressed .  

## 2012-10-16 NOTE — Assessment & Plan Note (Signed)
Readings have been normal for the last year.

## 2012-10-19 ENCOUNTER — Encounter: Payer: Self-pay | Admitting: *Deleted

## 2012-10-28 ENCOUNTER — Telehealth: Payer: Self-pay | Admitting: Internal Medicine

## 2012-10-28 NOTE — Telephone Encounter (Signed)
Needing a statement as to the reason why she can't have the flu shot. Fax # [phone] Attention Sandra Cockayne.

## 2012-10-29 NOTE — Telephone Encounter (Signed)
Tried to reach patient on both home an mobile unable to reach or leave message.

## 2012-11-01 NOTE — Telephone Encounter (Signed)
I know of no reason why a flu shot is contraindicated in this patient.  can you find out why?

## 2012-11-01 NOTE — Telephone Encounter (Signed)
Please advise cannot reach patient no answer, or voicemail.

## 2012-11-04 NOTE — Telephone Encounter (Signed)
i cannot write the letter abstaining her from the flu shot because of a reaction to a totally different vaccine, and I think given her line of work that she should have the influenza shot, unless she is allergic to eggs

## 2012-11-04 NOTE — Telephone Encounter (Signed)
Patient stated that it is documented when you were at [facility], That she had a severe reaction to the TDAP vaccine and then it caused liver jaunice and severe swelling and patient has feared taking any vaccine since that time, Patient stated if you advise of the flu shot she will do what ever you say but she is very scared.

## 2012-11-05 NOTE — Telephone Encounter (Signed)
Left message for patient to call office.  

## 2012-11-14 IMAGING — US ABDOMEN ULTRASOUND
1 series · 17 of 25 positions shown · non-contrast
Comparison: none

REASON FOR EXAM: indigestion RUQ pain
COMMENTS:

[Series 1: abdomen ultrasound · 17 of 76 slices shown]
[im 1/76]
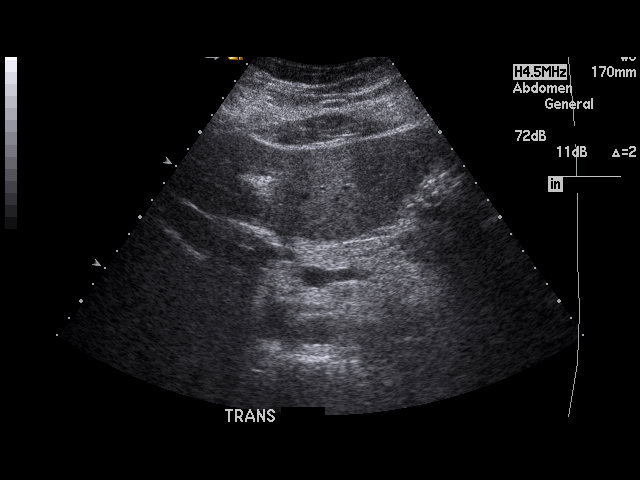
[im 7/76]
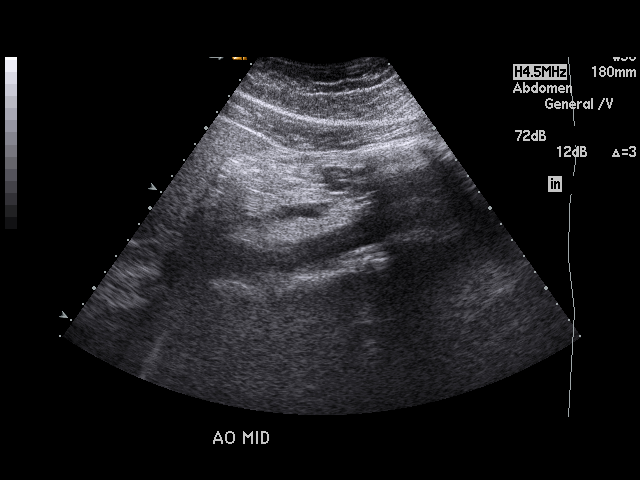
[im 10/76]
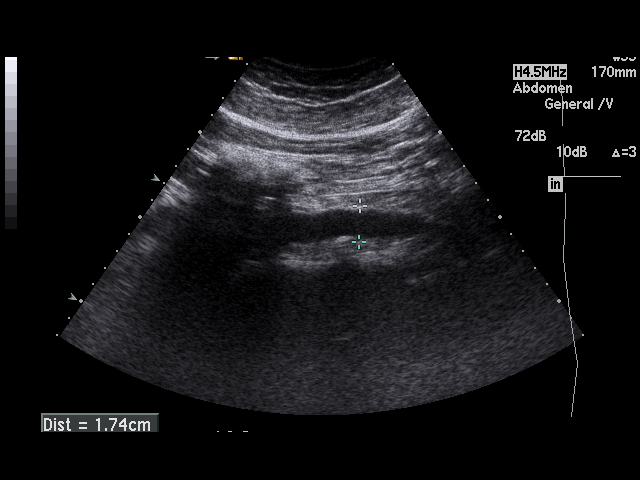
[im 16/76]
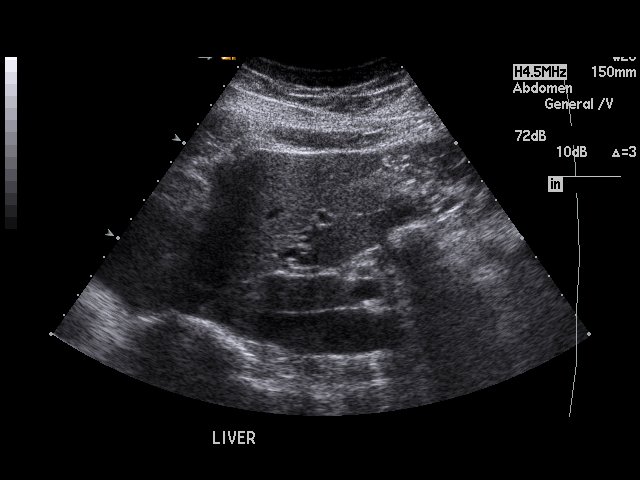
[im 19/76]
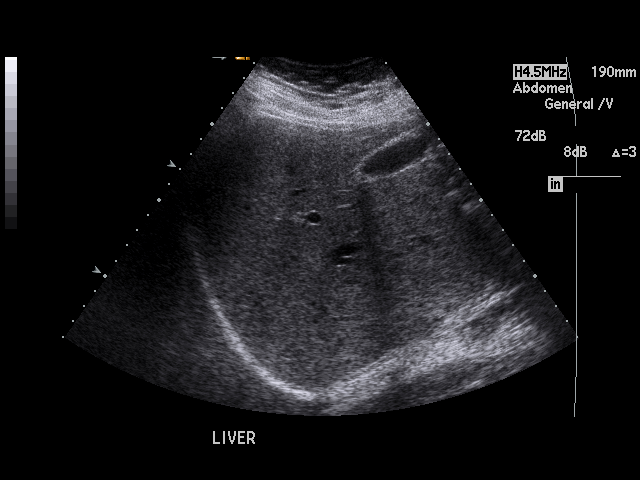
[im 26/76]
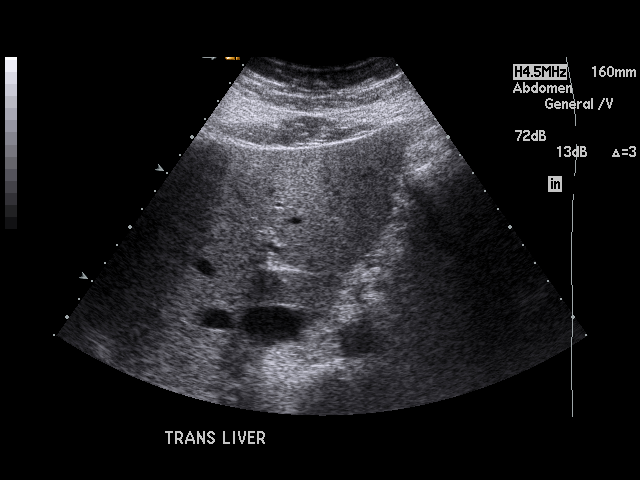
[im 29/76]
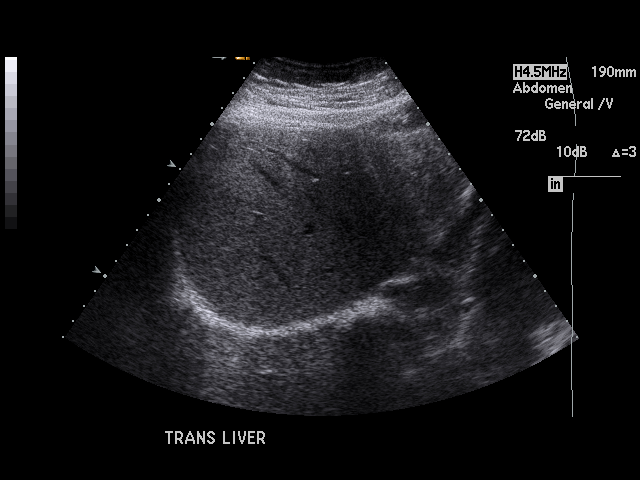
[im 35/76]
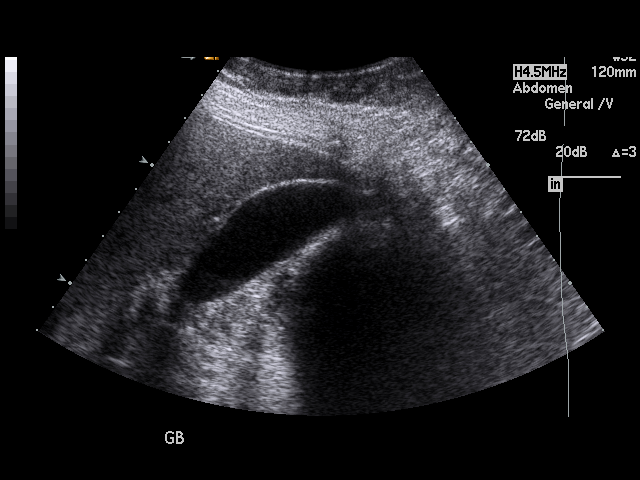
[im 38/76]
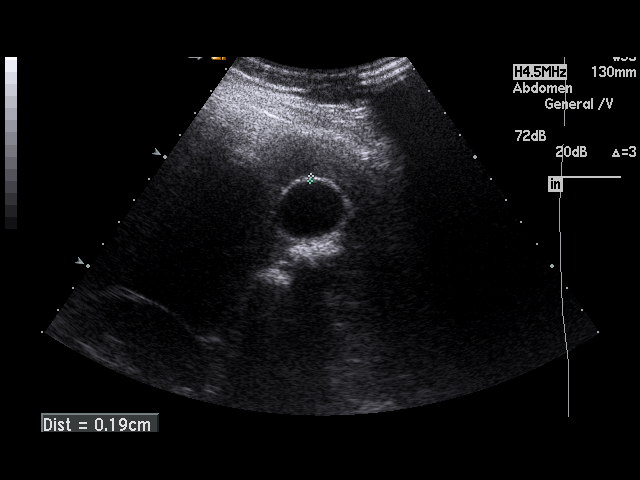
[im 41/76]
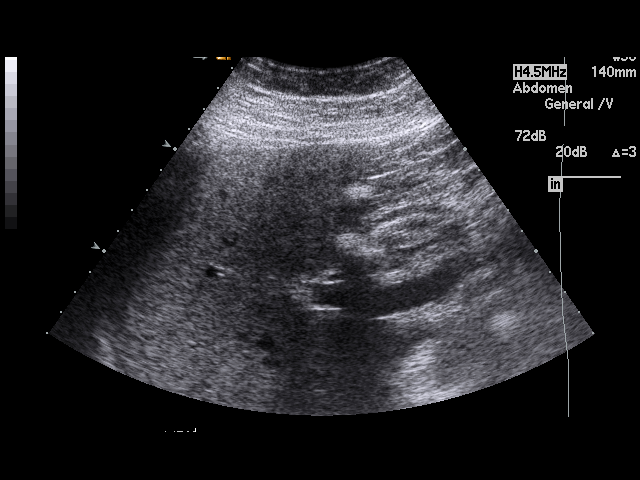
[im 47/76]
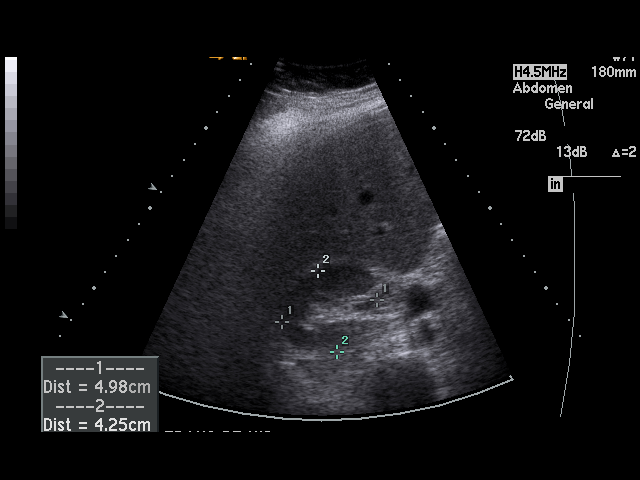
[im 51/76]
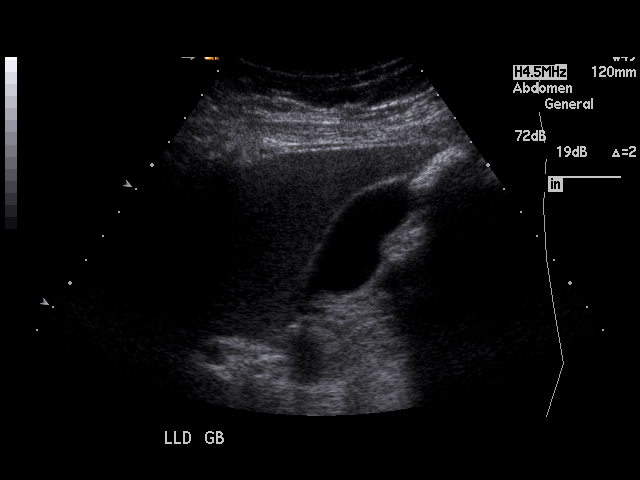
[im 57/76]
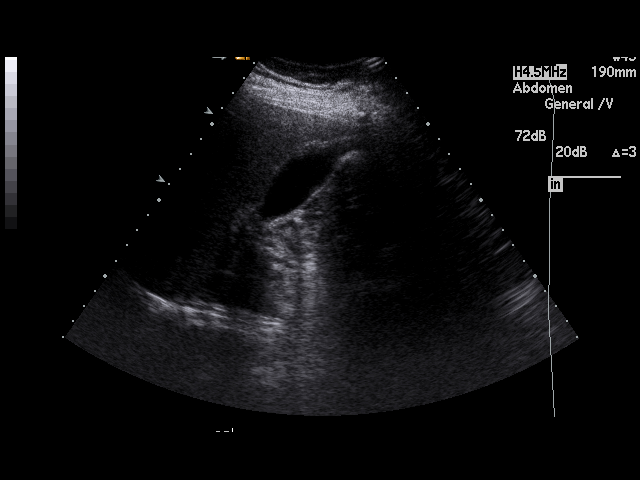
[im 60/76]
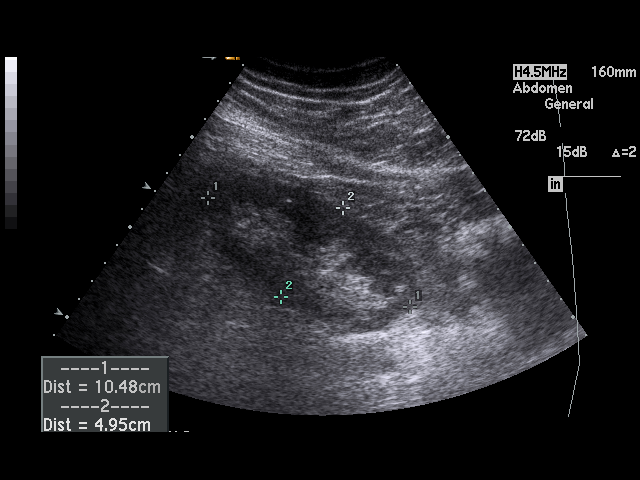
[im 66/76]
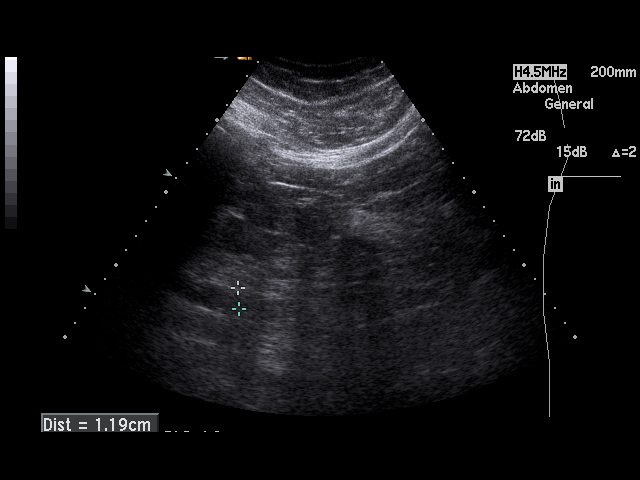
[im 69/76]
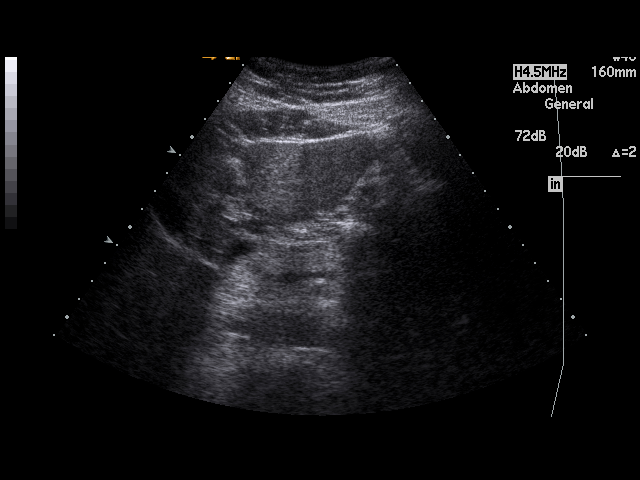
[im 76/76]
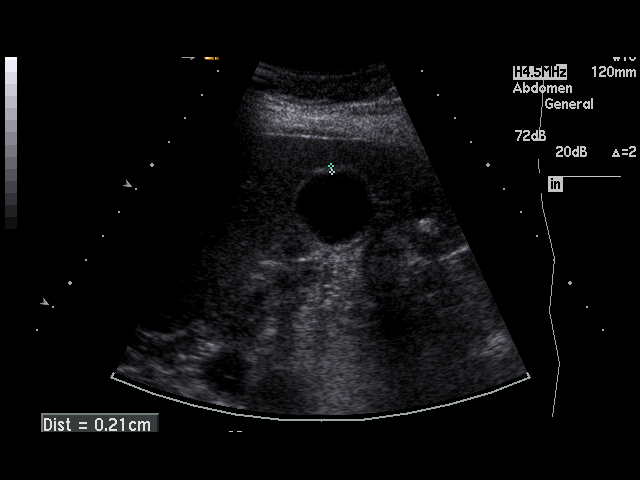

[17 of 25 positions shown; findings below may reference images not displayed]

PROCEDURE:     US  - US ABDOMEN GENERAL SURVEY  - April 11, 2010  [DATE]

RESULT:     The liver exhibits normal echotexture with no focal mass nor
ductal dilation. Portal venous flow is normal in direction toward the liver.
The gallbladder is adequately distended with no evidence of stones, wall
thickening, or pericholecystic fluid. The common bile duct is normal at
mm in diameter. The pancreatic head and body were demonstrated and appeared
normal. The pancreatic tail was obscured by bowel gas. The spleen, abdominal
aorta, inferior vena cava, and kidneys are normal in appearance. There is no
evidence of ascites.
IMPRESSION: Normal abdominal ultrasound examination. Evaluation of the
pancreatic tail was limited due to bowel gas.

## 2012-11-16 NOTE — Telephone Encounter (Signed)
Pt asking for a note stating she had a severe reaction to the Tdap. States that was a question when trying to get flu shot. Advised pt of earlier message about not able to write a note about abstaining from flu vaccine.

## 2012-12-20 ENCOUNTER — Other Ambulatory Visit: Payer: Self-pay | Admitting: Internal Medicine

## 2012-12-20 MED ORDER — HYDROCODONE-ACETAMINOPHEN 5-325 MG PO TABS: 1.0000 | ORAL_TABLET | Freq: Every day | ORAL | Status: DC | PRN

## 2012-12-20 NOTE — Telephone Encounter (Signed)
Vicodin refill needed.

## 2012-12-20 NOTE — Telephone Encounter (Signed)
Last visit 10/14/12, ok to refill?

## 2012-12-21 NOTE — Telephone Encounter (Signed)
Left message that Rx ready for pickup 

## 2013-01-12 ENCOUNTER — Encounter: Payer: Self-pay | Admitting: Internal Medicine

## 2013-01-12 ENCOUNTER — Ambulatory Visit (INDEPENDENT_AMBULATORY_CARE_PROVIDER_SITE_OTHER): Payer: PRIVATE HEALTH INSURANCE | Admitting: Internal Medicine

## 2013-01-12 VITALS — BP 118/70 | HR 68 | Temp 98.7°F | Resp 14 | Wt 167.0 lb

## 2013-01-12 DIAGNOSIS — A389 Scarlet fever, uncomplicated: Secondary | ICD-10-CM

## 2013-01-12 DIAGNOSIS — Z139 Encounter for screening, unspecified: Secondary | ICD-10-CM

## 2013-01-12 LAB — POCT INFLUENZA A/B
Influenza A, POC: NEGATIVE
Influenza B, POC: NEGATIVE

## 2013-01-12 MED ORDER — HYDROCODONE-ACETAMINOPHEN 5-325 MG PO TABS: 1.0000 | ORAL_TABLET | Freq: Every day | ORAL | Status: DC | PRN

## 2013-01-12 MED ORDER — AMOXICILLIN-POT CLAVULANATE 875-125 MG PO TABS: 1.0000 | ORAL_TABLET | Freq: Two times a day (BID) | ORAL | Status: DC

## 2013-01-12 NOTE — Patient Instructions (Addendum)
I am treating you for scarlet fever regardless of your negative  strep test with augmentin  for 7 days.  Please take a probiotic ( Align, Floraque or Culturelle) while you are on the antibiotic to prevent a serious antibiotic associated diarrhea  Called closirudium dificile colitis as well as a vaginal yeast infection   Scarlet Fever Scarlet fever is an infectious disease that can develop with a strep throat. It usually occurs in school-age children and can spread from person to person (contagious). Scarlet fever seldom causes any long-term problems.  CAUSES Scarlet fever is caused by the bacteria (Streptococcus pyogenes).  SYMPTOMS  Sore throat, fever, and headache.  Mild abdominal pain.  Tongue may become red (strawberry tongue).  Red rash that starts 1 to 2 days after fever begins. Rash starts on face and spreads to rest of body.  Rash looks and feels like "goose bumps" or sandpaper and may itch.  Rash lasts 3 to 7 days and then starts to peel. Peeling may last 2 weeks. DIAGNOSIS Scarlet fever typically is diagnosed by physical exam and throat culture.Rapid strep testing is often available. TREATMENT Antibiotic medicine will be prescribed. It usually takes 24 to 48 hours after beginning antibiotics to start feeling better.  HOME CARE INSTRUCTIONS  Rest and get plenty of sleep.  Take your antibiotics as directed. Finish them even if you start to feel better.  Gargle a mixture of 1 tsp of salt and 8 oz of water to soothe the throat.  Drink enough fluids to keep your urine clear or pale yellow.  While the throat is very sore, eat soft or liquid foods such as milk, milk shakes, ice cream, frozen yogurts, soups, or instant breakfast milk drinks. Cold sport drinks, smoothies, or frozen ice pops are good choices for hydrating.  Family members who develop a sore throat or fever should see a caregiver.  Only take over-the-counter or prescription medicines for pain, discomfort, or  fever as directed by your caregiver. Do not use aspirin.  Follow up with your caregiver about test results if necessary. SEEK MEDICAL CARE IF:  There is no improvement even after 48 to 72 hours of treatment or the symptoms worsen.  There is green, yellow-brown, or bloody phlegm.  There is joint pain or leg swelling.  Paleness, weakness, and fast breathing develop.  There is dry mouth, no urination, or sunken eyes (dehydration).  There is dark brown or bloody urine. SEEK IMMEDIATE MEDICAL CARE IF:  There is drooling or swallowing problems.  There are breathing problems.  There is a voice change.  There is neck pain. MAKE SURE YOU:   Understand these instructions.  Will watch your condition.  Will get help right away if you are not doing well or get worse. Document Released: 01/18/2000 Document Revised: 04/14/2011 Document Reviewed: 07/14/2010 [facility] Patient Information 2014 [place], [place].

## 2013-01-12 NOTE — Progress Notes (Signed)
Patient ID: JHADA RISK, female   DOB: [birth year], 57 y.o.   MRN: [id number]   Patient Active Problem List   Diagnosis Date Noted  . Scarlet fever, uncomplicated 01/15/2013  . Anxiety state, unspecified 08/16/2012  . Cystocele 10/28/2011  . Routine general medical examination at a health care facility 10/28/2011  . Obesity (BMI 30.0-34.9) 03/23/2011  . Elevated blood pressure reading without diagnosis of hypertension 03/23/2011  . Weight gain 03/21/2011  . Pain in joint involving ankle and foot 10/01/2010    Subjective:  CC:   Chief Complaint  Patient presents with  . Rash    Burns does not itch  . Sore Throat    x 1 sore throat first then rash appeared    HPI:   Rayburn Felt a 57 y.o. female who presents  Past Medical History  Diagnosis Date  . GERD (gastroesophageal reflux disease)   . Sinusitis 2003    History  . Insomnia   . Depression   . Anxiety     Past Surgical History  Procedure Laterality Date  . Knee arthroscopy    . 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 Joint replacement  2011    bilateral knee       The following portions of the patient's history were reviewed and updated as appropriate: Allergies, current medications, and problem list.    Review of Systems:   12 Pt  review of systems was negative except those addressed in the HPI,     History   Social History  . Marital Status: Divorced    Spouse Name: N/A    Number of Children: N/A  . Years of Education: N/A   Occupational History  . Intrepretor     Full-Time at [employer]   Social History Main Topics  . Smoking status: Never Smoker   . Smokeless tobacco: Never Used  . Alcohol Use: 3.0 oz/week    5 Glasses of wine per week     Comment: occasional  . Drug Use: No  . Sexual Activity: Not on file   Other Topics Concern  . Not on file   Social History Narrative   Exercises regularly             Objective:  Filed Vitals:   01/12/13  1555  BP: 118/70  Pulse: 68  Temp: 98.7 F (37.1 C)  Resp: 14     General appearance: alert, cooperative and appears stated age Ears: normal TM's and external ear canals both ears Throat: lips, mucosa, and tongue normal; teeth and gums normal . Tonsils erythematous Neck: no cervical  adenopathy, no carotid bruit, supple, symmetrical, trachea midline and thyroid not enlarged, symmetric, no tenderness/mass/nodules Back: symmetric, no curvature. ROM normal. No CVA tenderness. Lungs: clear to auscultation bilaterally Heart: regular rate and rhythm, S1, S2 normal, no murmur, click, rub or gallop Abdomen: soft, non-tender; bowel sounds normal; no masses,  no organomegaly Pulses: 2+ and symmetric Skin: Blanching rough textured facial rash Lymph nodes: Cervical, supraclavicular, and axillary nodes normal.  Assessment and Plan:  Scarlet fever, uncomplicated treating empirically with augmentin    Updated Medication List Outpatient Encounter Prescriptions as of 01/12/2013  Medication Sig  . citalopram (CELEXA) 20 MG tablet Take 1 tablet (20 mg total) by mouth daily.  Marland Kitchen zolpidem (AMBIEN CR) 12.5 MG CR tablet Take 1 tablet (12.5 mg total) by mouth at bedtime as needed for sleep.  Marland Kitchen amoxicillin-clavulanate (AUGMENTIN) 875-125 MG  per tablet Take 1 tablet by mouth 2 (two) times daily.  Marland Kitchen HYDROcodone-acetaminophen (NORCO/VICODIN) 5-325 MG per tablet Take 1 tablet by mouth daily as needed.  . [DISCONTINUED] HYDROcodone-acetaminophen (NORCO/VICODIN) 5-325 MG per tablet Take 1 tablet by mouth daily as needed.     Orders Placed This Encounter  Procedures  . POCT Influenza A/B  . POCT rapid strep A    No Follow-up on file.

## 2013-01-15 ENCOUNTER — Encounter: Payer: Self-pay | Admitting: Internal Medicine

## 2013-01-15 DIAGNOSIS — A389 Scarlet fever, uncomplicated: Secondary | ICD-10-CM | POA: Insufficient documentation

## 2013-01-15 NOTE — Assessment & Plan Note (Signed)
treating empirically with augmentin

## 2013-03-10 NOTE — Telephone Encounter (Signed)
Patient never picked this prescription up. Rx shredded today

## 2013-03-29 ENCOUNTER — Telehealth: Payer: Self-pay | Admitting: *Deleted

## 2013-03-29 MED ORDER — CITALOPRAM HYDROBROMIDE 20 MG PO TABS: 20.0000 mg | ORAL_TABLET | Freq: Every day | ORAL | Status: DC

## 2013-03-29 NOTE — Telephone Encounter (Signed)
Patient called has an appointment scheduled for 04/11/13 patient stated she ran out of citalopram in December and has not called for refill and has had worsening symptoms of depression since and cannot sleep patient stated she has Ambien but is needing something for the depression and does not feel she can wait for appointment. Would like refill on citalopram please advise.

## 2013-03-29 NOTE — Telephone Encounter (Signed)
citalopram refilled..start with 1/2 tablet daily for the first week to avoid nausea since she was on 20 mg when she stopped it

## 2013-03-29 NOTE — Telephone Encounter (Signed)
Pt called to change her phone number

## 2013-03-30 NOTE — Telephone Encounter (Signed)
Left message for patient to call office.  

## 2013-03-30 NOTE — Telephone Encounter (Signed)
Left message for patient to return call to office. 

## 2013-04-01 NOTE — Telephone Encounter (Signed)
Patient scheduled to be seen on Monday

## 2013-04-01 NOTE — Telephone Encounter (Signed)
4:30 Today  Or 6:30 Monday

## 2013-04-01 NOTE — Telephone Encounter (Signed)
Have tried to reach patient to have her come in today at 4.30

## 2013-04-01 NOTE — Telephone Encounter (Signed)
Patient is very upset on phone has an appointment scheduled with you on 3/9/ but stated she cannot wait that long feels she is going out of her mind offeree the earliest you have on 3/4/ it is 15 minute spot.  Will this be Okay?

## 2013-04-04 ENCOUNTER — Encounter: Payer: Self-pay | Admitting: Internal Medicine

## 2013-04-04 ENCOUNTER — Encounter (INDEPENDENT_AMBULATORY_CARE_PROVIDER_SITE_OTHER): Payer: Self-pay

## 2013-04-04 ENCOUNTER — Ambulatory Visit (INDEPENDENT_AMBULATORY_CARE_PROVIDER_SITE_OTHER): Payer: PRIVATE HEALTH INSURANCE | Admitting: Internal Medicine

## 2013-04-04 VITALS — BP 108/68 | HR 67 | Temp 98.4°F | Resp 16 | Wt 168.5 lb

## 2013-04-04 DIAGNOSIS — F22 Delusional disorders: Secondary | ICD-10-CM

## 2013-04-04 MED ORDER — BUSPIRONE HCL 7.5 MG PO TABS: 7.5000 mg | ORAL_TABLET | Freq: Three times a day (TID) | ORAL | Status: DC

## 2013-04-04 NOTE — Progress Notes (Signed)
Patient ID: Michelle Ross, female   DOB: [birth year], 58 y.o.   MRN: [id number]  Patient Active Problem List   Diagnosis Date Noted  . Acute paranoia 04/05/2013  . Scarlet fever, uncomplicated 01/15/2013  . Anxiety state, unspecified 08/16/2012  . Cystocele 10/28/2011  . Routine general medical examination at a health care facility 10/28/2011  . Obesity (BMI 30.0-34.9) 03/23/2011  . Elevated blood pressure reading without diagnosis of hypertension 03/23/2011  . Weight gain 03/21/2011  . Pain in joint involving ankle and foot 10/01/2010    Subjective:  CC:   Chief Complaint  Patient presents with  . Follow-up    depression    HPI:   Michelle Ross is a 58 y.o. female who presents for  Uncontrolled anxiety and paranoia .  For the last 2 months she has been experiencing persistent feeling that her colleagues and acquaintances as well as strangers have been monitoring her activities including how much wine she drinks, and making inappropriate comments that refelct a persistent feeling of guilt about her past indiscretions as a younger woman.  Feels alineated from duaghter who is currently studying abroad.  Does not want to date or even socialize because of her current feelings.   Recognizes that her symptoms are not normal  And several friends have noted concern,.  Not suicidal  Not hearing voices.. Feels safe at home. She decided to resume the citalopram last week      Past Medical History  Diagnosis Date  . GERD (gastroesophageal reflux disease)   . Sinusitis 2003    History  . Insomnia   . Depression   . Anxiety     Past Surgical History  Procedure Laterality Date  . Knee arthroscopy    . 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 Joint replacement  2011    bilateral knee       The following portions of the patient's history were reviewed and updated as appropriate: Allergies, current medications, and problem list.    Review of Systems:   Patient denies  headache, fevers, malaise, unintentional weight loss, skin rash, eye pain, sinus congestion and sinus pain, sore throat, dysphagia,  hemoptysis , cough, dyspnea, wheezing, chest pain, palpitations, orthopnea, edema, abdominal pain, nausea, melena, diarrhea, constipation, flank pain, dysuria, hematuria, urinary  Frequency, nocturia, numbness, tingling, seizures,  Focal weakness, Loss of consciousness,  Tremor, insomnia, depression, anxiety, and suicidal ideation.     History   Social History  . Marital Status: Divorced    Spouse Name: N/A    Number of Children: N/A  . Years of Education: N/A   Occupational History  . Intrepretor     Full-Time at [employer]   Social History Main Topics  . Smoking status: Never Smoker   . Smokeless tobacco: Never Used  . Alcohol Use: 3.0 oz/week    5 Glasses of wine per week     Comment: occasional  . Drug Use: No  . Sexual Activity: Not on file   Other Topics Concern  . Not on file   Social History Narrative   Exercises regularly             Objective:  Filed Vitals:   04/04/13 1830  BP: 108/68  Pulse: 67  Temp: 98.4 F (36.9 C)  Resp: 16     General appearance: alert, cooperative and appears stated age Ears: normal TM's and external ear canals both ears Throat: lips, mucosa, and tongue  normal; teeth and gums normal Neck: no adenopathy, no carotid bruit, supple, symmetrical, trachea midline and thyroid not enlarged, symmetric, no tenderness/mass/nodules Back: symmetric, no curvature. ROM normal. No CVA tenderness. Lungs: clear to auscultation bilaterally Heart: regular rate and rhythm, S1, S2 normal, no murmur, click, rub or gallop Abdomen: soft, non-tender; bowel sounds normal; no masses,  no organomegaly Pulses: 2+ and symmetric Skin: Skin color, texture, turgor normal. No rashes or lesions Lymph nodes: Cervical, supraclavicular, and axillary nodes normal. Psych:  Tearful,  Anxious,  Speech not pressured. Makes good  eye contact,  Not suicial.   Assessment and Plan:  Acute paranoia No history of audo or visual hallucinations.  I suspect her symptosm are due to severe anxiety plus/minus depression.  Trial of buspirone.  Stop citalopram and return in 2 weeks.   A total of 40 minutes was spent with patient more than half of which was spent in counseling and coordination of care.   Updated Medication List Outpatient Encounter Prescriptions as of 04/04/2013  Medication Sig  . citalopram (CELEXA) 20 MG tablet Take 1 tablet (20 mg total) by mouth daily.  Marland Kitchen. zolpidem (AMBIEN CR) 12.5 MG CR tablet Take 1 tablet (12.5 mg total) by mouth at bedtime as needed for sleep.  Marland Kitchen. amoxicillin-clavulanate (AUGMENTIN) 875-125 MG per tablet Take 1 tablet by mouth 2 (two) times daily.  . busPIRone (BUSPAR) 7.5 MG tablet Take 1 tablet (7.5 mg total) by mouth 3 (three) times daily.  Marland Kitchen. HYDROcodone-acetaminophen (NORCO/VICODIN) 5-325 MG per tablet Take 1 tablet by mouth daily as needed.     No orders of the defined types were placed in this encounter.    No Follow-up on file.

## 2013-04-04 NOTE — Patient Instructions (Signed)
I would like you to try a different medication for your anxiety  Called buspirone .  It may work better [employer] citalopraqm  It can be taken up to three times daily and the dose can be increased if needed  Please stop the citalopram in two days.  Return in 2 weeks.

## 2013-04-04 NOTE — Progress Notes (Signed)
Pre-visit discussion using our clinic review tool. No additional management support is needed unless otherwise documented below in the visit note.  

## 2013-04-05 DIAGNOSIS — F22 Delusional disorders: Secondary | ICD-10-CM | POA: Insufficient documentation

## 2013-04-05 NOTE — Assessment & Plan Note (Signed)
No history of audo or visual hallucinations.  I suspect her symptosm are due to severe anxiety plus/minus depression.  Trial of buspirone.  Stop citalopram and return in 2 weeks.

## 2013-04-06 ENCOUNTER — Ambulatory Visit: Payer: PRIVATE HEALTH INSURANCE | Admitting: Internal Medicine

## 2013-04-11 ENCOUNTER — Ambulatory Visit: Payer: PRIVATE HEALTH INSURANCE | Admitting: Internal Medicine

## 2013-04-15 ENCOUNTER — Other Ambulatory Visit: Payer: Self-pay | Admitting: Internal Medicine

## 2013-04-15 NOTE — Telephone Encounter (Signed)
Last visit 04/04/13, ok refill? 

## 2013-04-15 NOTE — Telephone Encounter (Signed)
Rx faxed to pharmacy  

## 2013-04-15 NOTE — Telephone Encounter (Signed)
I am fine with refilling this, but can you call and make sure that pt is doing better after recent visit.

## 2013-04-15 NOTE — Telephone Encounter (Signed)
Left message for pt to return my call.

## 2013-04-21 ENCOUNTER — Encounter: Payer: Self-pay | Admitting: Internal Medicine

## 2013-04-21 ENCOUNTER — Ambulatory Visit (INDEPENDENT_AMBULATORY_CARE_PROVIDER_SITE_OTHER): Payer: PRIVATE HEALTH INSURANCE | Admitting: Internal Medicine

## 2013-04-21 VITALS — BP 100/60 | HR 81 | Resp 16 | Wt 161.8 lb

## 2013-04-21 DIAGNOSIS — F341 Dysthymic disorder: Secondary | ICD-10-CM

## 2013-04-21 DIAGNOSIS — F22 Delusional disorders: Secondary | ICD-10-CM

## 2013-04-21 DIAGNOSIS — F418 Other specified anxiety disorders: Secondary | ICD-10-CM

## 2013-04-21 DIAGNOSIS — F411 Generalized anxiety disorder: Secondary | ICD-10-CM

## 2013-04-21 MED ORDER — QUETIAPINE FUMARATE 50 MG PO TABS: 25.0000 mg | ORAL_TABLET | Freq: Every day | ORAL | Status: DC

## 2013-04-21 NOTE — Patient Instructions (Addendum)
YOU ARE A WONDERFUL,  COMPASSIONATE PERSON!    YOUR GUILT OVER PAST DECISIONS THAT YOU REGRET IS MAKING YOU PARANOID!!!!  THIS IS CAUSING YOUR TO "PROJECT" YOUR FEELINGS ONTO OTHERS AND TWISTING THEIR COMMENTS INTO SOMETHING THEY ARE NOT     If you feel the buspirone is helping your anxeity ,please continue it twice daily  I am adding  seroquel 50 mg to take before bedtime to help you rest   Return on Monday evening 6:30   I am referring you to Dr Maryruth BunKapur, whom I trust very much

## 2013-04-21 NOTE — Progress Notes (Signed)
Pre visit review using our clinic review tool, if applicable. No additional management support is needed unless otherwise documented below in the visit note. 

## 2013-04-21 NOTE — Progress Notes (Signed)
Patient ID: Michelle Ross, female   DOB: [birth year], 58 y.o.   MRN: [id number]   Patient Active Problem List   Diagnosis Date Noted  . Depression with anxiety 04/21/2013  . Acute paranoia 04/05/2013  . Scarlet fever, uncomplicated 01/15/2013  . Anxiety state, unspecified 08/16/2012  . Cystocele 10/28/2011  . Routine general medical examination at a health care facility 10/28/2011  . Obesity (BMI 30.0-34.9) 03/23/2011  . Elevated blood pressure reading without diagnosis of hypertension 03/23/2011  . Weight gain 03/21/2011  . Pain in joint involving ankle and foot 10/01/2010    Subjective:  CC:   Chief Complaint  Patient presents with  . Follow-up    taking buspirone twie daily    HPI:   Michelle Ross is a 58 y.o. female who presents for Follow up on uncontrolled anxiety with paranoid tendencies  Patient was started on buspar two weeks ago and returns for follow up. She appears calm and is well groomed.  "something is going on, Dr. Darrick Huntsman"   Her paranoia  Is worse.  She is citing weird things happening every where, people looking at her strangely, friends are giving her books to read that suggests to her that they suspect her of having committed terrible acts.   She expresses significant guilt and remorse  over an abortion that she had over 15 yrars ago during a separation from her now divorced husband and feels certain that all of her friends and coworkers know her secret.  She has no auditory of visual hallucination and is not suicidal. She has not taken vicodin or alprazolam in many weeks.    Past Medical History  Diagnosis Date  . GERD (gastroesophageal reflux disease)   . Sinusitis 2003    History  . Insomnia   . Depression   . Anxiety     Past Surgical History  Procedure Laterality Date  . Knee arthroscopy    . 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 Joint replacement  2011    bilateral knee       The following portions of the patient's history were  reviewed and updated as appropriate: Allergies, current medications, and problem list.    Review of Systems:   Patient denies headache, fevers, malaise, unintentional weight loss, skin rash, eye pain, sinus congestion and sinus pain, sore throat, dysphagia,  hemoptysis , cough, dyspnea, wheezing, chest pain, palpitations, orthopnea, edema, abdominal pain, nausea, melena, diarrhea, constipation, flank pain, dysuria, hematuria, urinary  Frequency, nocturia, numbness, tingling, seizures,  Focal weakness, Loss of consciousness,  Tremor, insomnia, depression, anxiety, and suicidal ideation.     History   Social History  . Marital Status: Divorced    Spouse Name: N/A    Number of Children: N/A  . Years of Education: N/A   Occupational History  . Intrepretor     Full-Time at [employer]   Social History Main Topics  . Smoking status: Never Smoker   . Smokeless tobacco: Never Used  . Alcohol Use: 3.0 oz/week    5 Glasses of wine per week     Comment: occasional  . Drug Use: No  . Sexual Activity: Not on file   Other Topics Concern  . Not on file   Social History Narrative   Exercises regularly             Objective:  Filed Vitals:   04/21/13 0905  BP: 100/60  Pulse: 81  Resp: 16  General appearance: alert, cooperative and appears stated age Ears: normal TM's and external ear canals both ears Throat: lips, mucosa, and tongue normal; teeth and gums normal Neck: no adenopathy, no carotid bruit, supple, symmetrical, trachea midline and thyroid not enlarged, symmetric, no tenderness/mass/nodules Back: symmetric, no curvature. ROM normal. No CVA tenderness. Lungs: clear to auscultation bilaterally Heart: regular rate and rhythm, S1, S2 normal, no murmur, click, rub or gallop Abdomen: soft, non-tender; bowel sounds normal; no masses,  no organomegaly Pulses: 2+ and symmetric Skin: Skin color, texture, turgor normal. No rashes or lesions Lymph nodes: Cervical,  supraclavicular, and axillary nodes normal.  Assessment and Plan:  Acute paranoia Starting seroquel at 50 mg qhs. Return in one week.   Anxiety state, unspecified She feels that her anxiety has improved with change in medication from citalopram to buspirone so we will continue this medication for now, but I think she will need psychiatric treatment for management given her concurrent depression and paranoia .  I am referring her to either Dr. Maryruth BunKapur of Dr. April MansonPulcinowska   Updated Medication List Outpatient Encounter Prescriptions as of 04/21/2013  Medication Sig  . busPIRone (BUSPAR) 7.5 MG tablet Take 1 tablet (7.5 mg total) by mouth 3 (three) times daily.  Marland Kitchen. zolpidem (AMBIEN CR) 12.5 MG CR tablet TAKE 1 TABLET BY MOUTH AT BEDTIME AS NEEDED FOR SLEEP  . QUEtiapine (SEROQUEL) 50 MG tablet Take 0.5 tablets (25 mg total) by mouth at bedtime.  . [DISCONTINUED] amoxicillin-clavulanate (AUGMENTIN) 875-125 MG per tablet Take 1 tablet by mouth 2 (two) times daily.  . [DISCONTINUED] citalopram (CELEXA) 20 MG tablet Take 1 tablet (20 mg total) by mouth daily.  . [DISCONTINUED] HYDROcodone-acetaminophen (NORCO/VICODIN) 5-325 MG per tablet Take 1 tablet by mouth daily as needed.     Orders Placed This Encounter  Procedures  . Ambulatory referral to Psychiatry    No Follow-up on file.

## 2013-04-23 ENCOUNTER — Encounter: Payer: Self-pay | Admitting: Internal Medicine

## 2013-04-23 NOTE — Assessment & Plan Note (Signed)
She feels that her anxiety has improved with change in medication from citalopram to buspirone so we will continue this medication for now, but I think she will need psychiatric treatment for management given her concurrent depression and paranoia .  I am referring her to either Dr. Maryruth BunKapur of Dr. April MansonPulcinowska

## 2013-04-23 NOTE — Assessment & Plan Note (Addendum)
Starting seroquel at 50 mg qhs. Return in one week.

## 2013-04-25 ENCOUNTER — Ambulatory Visit (INDEPENDENT_AMBULATORY_CARE_PROVIDER_SITE_OTHER): Payer: PRIVATE HEALTH INSURANCE | Admitting: Internal Medicine

## 2013-04-25 ENCOUNTER — Encounter: Payer: Self-pay | Admitting: Internal Medicine

## 2013-04-25 VITALS — BP 102/68 | HR 78 | Temp 98.0°F | Resp 16 | Wt 166.5 lb

## 2013-04-25 DIAGNOSIS — F411 Generalized anxiety disorder: Secondary | ICD-10-CM

## 2013-04-25 DIAGNOSIS — F22 Delusional disorders: Secondary | ICD-10-CM

## 2013-04-25 NOTE — Progress Notes (Signed)
Patient ID: Michelle Ross, female   DO  Patient Active Problem List   Diagnosis Date Noted  . Depression with anxiety 04/21/2013  . Acute paranoia 04/05/2013  . Scarlet fever, uncomplicated 01/15/2013  . Anxiety state, unspecified 08/16/2012  . Cystocele 10/28/2011  . Routine general medical examination at a health care facility 10/28/2011  . Obesity (BMI 30.0-34.9) 03/23/2011  . Elevated blood pressure reading without diagnosis of hypertension 03/23/2011  . Weight gain 03/21/2011  . Pain in joint involving ankle and foot 10/01/2010    Subjective:  CC:   Chief Complaint  Patient presents with  . Follow-up    HPI:   Michelle NapMaria G Fujii is a 58 y.o. female who presents for  One week follow up on worsening anxiety with incraseing paranoia and insomnia.  At last visit she was prescribed serqoeul50 mg qhs,  She has taken 1/2 tablet for the last 3 night,s and is sleeping and feeling better,  She has been avoding alcohol and narcotics.,  Taking 1/3 of a tablet of buspirone 7 .5 mg  once or twice daily.  Awaiting referral to psychiatry.  "I am making amends" with her ex husband,  And others  whom she feels she has treated badly.  "I am working on myself and getting my life back in order".  Still feels she is being monitored by colleagues and others and feels that comments she makes in one scenario are being passed along to others and making their way to her workplace.  Found an unopened beer can in one of her front yard trees and is worried that it is a cryptic message from a former Surveyor, mineralscontractor who did some work for her last year and tried to borrow money from her.  Asked  me today if anyone has enquired about her to me.  Frustrated that her [place] not respond to her messages immediaely.  Causes her to worry about her safety. She is self aware and wonders if her daughter realizes what she is going through.    Past Medical History  Diagnosis Date  . GERD  (gastroesophageal reflux disease)   . Sinusitis 2003    History  . Insomnia   . Depression   . Anxiety     Past Surgical History  Procedure Laterality Date  . Knee arthroscopy    . 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 Joint replacement  2011    bilateral knee       The following portions of the patient's history were reviewed and updated as appropriate: Allergies, current medications, and problem list.    Review of Systems:   Patient denies headache, fevers, malaise, unintentional weight loss, skin rash, eye pain, sinus congestion and sinus pain, sore throat, dysphagia,  hemoptysis , cough, dyspnea, wheezing, chest pain, palpitations, orthopnea, edema, abdominal pain, nausea, melena, diarrhea, constipation, flank pain, dysuria, hematuria, urinary  Frequency, nocturia, numbness, tingling, seizures,  Focal weakness, Loss of consciousness,  Tremor, insomnia, depression, anxiety, and suicidal ideation.     History   Social History  . Marital Status: Divorced    Spouse Name: N/A    Number of Children: N/A  . 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 Smoking status: Never Smoker   . Smokeless tobacco: Never Used  . Alcohol Use: 3.0 oz/week  5 Glasses of wine per week     Comment: occasional  . Drug Use: No  . Sexual Activity: Not on file   Other Topics Concern  . Not on file   Social History Narrative   Exercises regularly             Objective:  Filed Vitals:   04/25/13 1814  BP: 102/68  Pulse: 78  Temp: 98 F (36.7 C)  Resp: 16     General appearance: alert, cooperative and appears stated age Ears: normal TM's and external ear canals both ears Throat: lips, mucosa, and tongue normal; teeth and gums normal Neck: no adenopathy, no carotid bruit, supple, symmetrical, trachea midline and thyroid not enlarged, symmetric, no  tenderness/mass/nodules Back: symmetric, no curvature. ROM normal. No CVA tenderness. Lungs: clear to auscultation bilaterally Heart: regular rate and rhythm, S1, S2 normal, no murmur, click, rub or gallop Abdomen: soft, non-tender; bowel sounds normal; no masses,  no organomegaly Pulses: 2+ and symmetric Skin: Skin color, texture, turgor normal. No rashes or lesions Lymph nodes: Cervical, supraclavicular, and axillary nodes normal.  Assessment and Plan:  Acute paranoia Still present but less severe .  Urged to increase the seroquel to full tablet in one week if symptoms still present,  Return in 3 weeks .  Referral to psychiatry pending .  Anxiety state, unspecified Urged to increaes buspirone to 1/2 tablet bid and full tablet in 2 weeks   Updated Medication List Outpatient Encounter Prescriptions as of 04/25/2013  Medication Sig  . busPIRone (BUSPAR) 7.5 MG tablet Take 1 tablet (7.5 mg total) by mouth 3 (three) times daily.  . QUEtiapine (SEROQUEL) 50 MG tablet Take 0.5 tablets (25 mg total) by mouth at bedtime.  Marland Kitchen zolpidem (AMBIEN CR) 12.5 MG CR tablet TAKE 1 TABLET BY MOUTH AT BEDTIME AS NEEDED FOR SLEEP     No orders of the defined types were placed in this encounter.    No Follow-up on file.

## 2013-04-25 NOTE — Patient Instructions (Signed)
Work your way up to 1 tablet of  buspirone twice daily.  (7. 5 mg dose).  You may increase immediately to 1/2 tablet twice daily    And then go to a full tablet after 4 or 5 more days   Ok to increase the seroquel to full tablet at bedtime

## 2013-04-25 NOTE — Progress Notes (Signed)
Pre-visit discussion using our clinic review tool. No additional management support is needed unless otherwise documented below in the visit note.  

## 2013-04-26 NOTE — Assessment & Plan Note (Signed)
Still present but less severe .  Urged to increase the seroquel to full tablet in one week if symptoms still present,  Return in 3 weeks .  Referral to psychiatry pending .

## 2013-04-26 NOTE — Assessment & Plan Note (Signed)
Urged to increaes buspirone to 1/2 tablet bid and full tablet in 2 weeks

## 2013-05-03 ENCOUNTER — Encounter: Payer: Self-pay | Admitting: Emergency Medicine

## 2013-05-09 ENCOUNTER — Other Ambulatory Visit: Payer: Self-pay | Admitting: *Deleted

## 2013-05-09 NOTE — Telephone Encounter (Signed)
Patient calling requesting refill on Buspar please ad vise ok to fill.

## 2013-05-10 MED ORDER — BUSPIRONE HCL 7.5 MG PO TABS
7.5000 mg | ORAL_TABLET | Freq: Three times a day (TID) | ORAL | Status: DC
Start: ? — End: 2013-06-10

## 2013-05-10 NOTE — Telephone Encounter (Signed)
Ok to refill,  Refill sent  

## 2013-05-23 ENCOUNTER — Telehealth: Payer: Self-pay | Admitting: *Deleted

## 2013-05-23 MED ORDER — QUETIAPINE FUMARATE 50 MG PO TABS: ORAL_TABLET | ORAL | Status: AC

## 2013-05-23 NOTE — Telephone Encounter (Signed)
Patient requesting refill on Seroquel 50 mg. Be sent to [facility] to fill.

## 2013-05-23 NOTE — Telephone Encounter (Signed)
Ok to refill,  Refill sent  

## 2013-05-30 ENCOUNTER — Ambulatory Visit: Payer: PRIVATE HEALTH INSURANCE | Admitting: Internal Medicine

## 2013-05-31 ENCOUNTER — Ambulatory Visit (INDEPENDENT_AMBULATORY_CARE_PROVIDER_SITE_OTHER): Payer: PRIVATE HEALTH INSURANCE | Admitting: Internal Medicine

## 2013-05-31 ENCOUNTER — Encounter: Payer: Self-pay | Admitting: Internal Medicine

## 2013-05-31 VITALS — BP 100/68 | HR 77 | Temp 98.7°F | Resp 16 | Wt 168.0 lb

## 2013-05-31 DIAGNOSIS — F22 Delusional disorders: Secondary | ICD-10-CM

## 2013-05-31 DIAGNOSIS — F418 Other specified anxiety disorders: Secondary | ICD-10-CM

## 2013-05-31 DIAGNOSIS — F341 Dysthymic disorder: Secondary | ICD-10-CM

## 2013-05-31 MED ORDER — ZOLPIDEM TARTRATE ER 12.5 MG PO TBCR: 12.5000 mg | EXTENDED_RELEASE_TABLET | Freq: Every evening | ORAL | Status: AC | PRN

## 2013-05-31 NOTE — Assessment & Plan Note (Signed)
Resolved ,  Continue seroquel at bedtine

## 2013-05-31 NOTE — Progress Notes (Signed)
Patient ID: Michelle Ross, female   DOB: 04/30/1955, 58 y.o.   MRN: [id number]  Patient Active Problem List   Diagnosis Date Noted  . Depression with anxiety 04/21/2013  . Acute paranoia 04/05/2013  . Scarlet fever, uncomplicated 01/15/2013  . Anxiety state, unspecified 08/16/2012  . Cystocele 10/28/2011  . Routine general medical examination at a health care facility 10/28/2011  . Obesity (BMI 30.0-34.9) 03/23/2011  . Elevated blood pressure reading without diagnosis of hypertension 03/23/2011  . Weight gain 03/21/2011  . Pain in joint involving ankle and foot 10/01/2010    Subjective:  CC:   Chief Complaint  Patient presents with  . Follow-up    on depression,  patient still feels things are up and down.    HPI:   Michelle NapMaria G Cosma is a 58 y.o. female who presents for Follow up on paranoid depression. She has been feeling better with the increased dose of buspirone,  But continues  to voice frustration and anger about her professional  mistreatment at her current work.  Feels trapped  In her position financially and professionally,  Feels she is being asked to do things that are not appropriate for her training and does not feel respected , which aggravates her depression. Feels trapped,  No energy,  Despite beginning to clean up the clutter in her home and exercising again,  But does not feel it is helping,  Contemplating career change.     Past Medical History  Diagnosis Date  . GERD (gastroesophageal reflux disease)   . Sinusitis 2003    History  . Insomnia   . Depression   . Anxiety     Past Surgical History  Procedure Laterality Date  . Knee arthroscopy    . 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 Joint replacement  2011    bilateral knee       The following portions of the patient's history were reviewed and updated as appropriate: Allergies, current medications, and problem list.    Review of Systems:   Patient denies headache, fevers, malaise,  unintentional weight loss, skin rash, eye pain, sinus congestion and sinus pain, sore throat, dysphagia,  hemoptysis , cough, dyspnea, wheezing, chest pain, palpitations, orthopnea, edema, abdominal pain, nausea, melena, diarrhea, constipation, flank pain, dysuria, hematuria, urinary  Frequency, nocturia, numbness, tingling, seizures,  Focal weakness, Loss of consciousness,  Tremor, insomnia, depression, anxiety, and suicidal ideation.     History   Social History  . Marital Status: Divorced    Spouse Name: N/A    Number of Children: N/A  . 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 Smoking status: Never Smoker   . Smokeless tobacco: Never Used  . Alcohol Use: 3.0 oz/week    5 Glasses of wine per week     Comment: occasional  . Drug Use: No  . Sexual Activity: Not on file   Other Topics Concern  . Not on file   Social History Narrative   Exercises regularly             Objective:  Filed Vitals:   05/31/13 1102  BP: 100/68  Pulse: 77  Temp: 98.7 F (37.1 C)  Resp: 16     General appearance: alert, cooperative and appears stated age Ears: normal TM's and external ear canals both ears Throat: lips, mucosa, and tongue  normal; teeth and gums normal Neck: no adenopathy, no carotid bruit, supple, symmetrical, trachea midline and thyroid not enlarged, symmetric, no tenderness/mass/nodules Back: symmetric, no curvature. ROM normal. No CVA tenderness. Lungs: clear to auscultation bilaterally Heart: regular rate and rhythm, S1, S2 normal, no murmur, click, rub or gallop Abdomen: soft, non-tender; bowel sounds normal; no masses,  no organomegaly Pulses: 2+ and symmetric Skin: Skin color, texture, turgor normal. No rashes or lesions Lymph nodes: Cervical, supraclavicular, and axillary nodes normal.  Assessment and Plan:  Depression with anxiety Improving with buspirone.   Encouraged to consider psychology follow up and several names given.   Acute paranoia Resolved ,  Continue seroquel at bedtine    Updated Medication List Outpatient Encounter Prescriptions as of 05/31/2013  Medication Sig  . busPIRone (BUSPAR) 7.5 MG tablet Take 1 tablet (7.5 mg total) by mouth 3 (three) times daily.  . QUEtiapine (SEROQUEL) 50 MG tablet 1/2 to 1 tablet at bedtime  . zolpidem (AMBIEN CR) 12.5 MG CR tablet Take 1 tablet (12.5 mg total) by mouth at bedtime as needed for sleep.  . [DISCONTINUED] zolpidem (AMBIEN CR) 12.5 MG CR tablet TAKE 1 TABLET BY MOUTH AT BEDTIME AS NEEDED FOR SLEEP     No orders of the defined types were placed in this encounter.    Return in about 3 months (around 08/30/2013).

## 2013-05-31 NOTE — Progress Notes (Signed)
Pre-visit discussion using our clinic review tool. No additional management support is needed unless otherwise documented below in the visit note.  

## 2013-05-31 NOTE — Patient Instructions (Signed)
  Your medications are working .  Continue the buspirone three times daily,  The seroquel at night and resume ambien cr  I encourage you to start working with a therapist to help you focus your efforts  Here is a list of several in the area that are well respected

## 2013-05-31 NOTE — Assessment & Plan Note (Signed)
Improving with buspirone.  Encouraged to consider psychology follow up and several names given.

## 2013-06-10 ENCOUNTER — Telehealth: Payer: Self-pay | Admitting: *Deleted

## 2013-06-10 MED ORDER — BUSPIRONE HCL 7.5 MG PO TABS: 7.5000 mg | ORAL_TABLET | Freq: Three times a day (TID) | ORAL | Status: AC

## 2013-06-10 NOTE — Telephone Encounter (Signed)
Patient would like script for Buspar sent to [facility] on [address] added to pharmacy ok to fill?

## 2013-06-10 NOTE — Telephone Encounter (Signed)
Ok to refill,  Refill sent  

## 2013-07-21 ENCOUNTER — Telehealth: Payer: Self-pay | Admitting: Internal Medicine

## 2013-07-21 DIAGNOSIS — F22 Delusional disorders: Secondary | ICD-10-CM

## 2013-07-21 DIAGNOSIS — F411 Generalized anxiety disorder: Secondary | ICD-10-CM

## 2013-07-21 DIAGNOSIS — F418 Other specified anxiety disorders: Secondary | ICD-10-CM

## 2013-07-21 NOTE — Telephone Encounter (Signed)
Referral is in process as requested 

## 2013-07-21 NOTE — Telephone Encounter (Signed)
Patient called back and states that she went to the crisis center today for depression and was told by the therapist today that she needed to have her medication adjusted. She was also told by her employee that she would need a note to return to work. I don't see where I can put the patient on your scheduled per her request please advise.

## 2013-07-21 NOTE — Telephone Encounter (Signed)
The patient is needing her Psych medication adjusted and she is needing a referral to a psychiatrist . She no showed the appointment with Dr. Maryruth BunKapur. She is know [facility] Brien MatesLema # [phone] Fax# [phone].

## 2013-07-21 NOTE — Addendum Note (Signed)
Addended by: Sherlene ShamsULLO, TERESA L on: 07/21/2013 08:49 PM   Modules accepted: Orders

## 2013-07-22 ENCOUNTER — Encounter: Payer: Self-pay | Admitting: Internal Medicine

## 2013-07-22 MED ORDER — CITALOPRAM HYDROBROMIDE 20 MG PO TABS: 20.0000 mg | ORAL_TABLET | Freq: Every day | ORAL | Status: AC

## 2013-07-22 NOTE — Telephone Encounter (Signed)
Patient stated she just felt sad, lonesome by herself,  And she was just trying to get out of the house more and [facility] Patient notified of  Citalopram script and letter ready for pick up. FYI

## 2013-07-22 NOTE — Telephone Encounter (Signed)
Left message fo r patient to return call to  Office. 

## 2013-07-22 NOTE — Telephone Encounter (Signed)
I do not have space available today, I have already added tow patients and am overbooked ,.  The patient was set up to see Dr Maryruth [facility]Kapur over a month ago [facility] so I have made the referral to Dr Brien MatesLema as she has requested but it will be a while.  I suggest she resume citalopram and have sent an rx to her pharmacy Please ask Lurena JoinerRebecca to find out what symptoms she is having to make sure she is not a danger to self or others .  The crisis center should have given her a note for work ,  If they did not I will write one .

## 2013-07-22 NOTE — Telephone Encounter (Signed)
Please advise 

## 2017-07-23 ENCOUNTER — Telehealth: Payer: Self-pay | Admitting: *Deleted

## 2017-07-23 NOTE — Telephone Encounter (Signed)
Copied from CRM [phone]. Topic: General - Other >> Jul 22, 2017  6:39 PM Debroah LoopLander, Lumin L wrote: Reason for CRM: Patient had an allergic reaction to tdap in 2009 and her employer needs documentation of this. Patient will fax the form they gave her tomorrow evening. Please call patient once form is received.

## 2017-07-23 NOTE — Telephone Encounter (Signed)
Pt would like to provide the fax number to send the information back to  Attn: Jarold SongLisa A Williams  Fax: [phone]  Clinical manager at [employer]

## 2017-07-24 NOTE — Telephone Encounter (Signed)
Copied from CRM [phone]#[id number]. Topic: General - Other >> Jul 22, 2017  6:39 PM Debroah LoopLander, Lumin L wrote: Reason for CRM: Patient had an allergic reaction to tdap in 2009 and her employer needs documentation of this. Patient will fax the form they gave her tomorrow evening. Please call patient once form is received.  >> Jul 24, 2017 12:44 PM Percival SpanishKennedy, Cheryl W wrote:   Pt call today to say the form needs to be faxed to the below number   [phone]

## 2017-07-27 NOTE — Telephone Encounter (Signed)
Pt calling to check status on form being faxed to her employer.

## 2017-07-28 NOTE — Telephone Encounter (Signed)
I'm sorry but There is nothing in her chart that indicates she is allergic to TDaP vaccine,  So I have filled out the form saying that she is not allergic to it.

## 2017-07-28 NOTE — Telephone Encounter (Signed)
Called pt and explained to her that we have not received a form to be filled out about her being allergic to the tdap vaccine. Pt stated that she would re fax the form this afternoon.

## 2017-07-28 NOTE — Telephone Encounter (Signed)
Form has been placed in red folder. Pt was last seen 05/31/2013.

## 2017-07-28 NOTE — Telephone Encounter (Signed)
Pt called in to check the status of fax pt says that she is aware of paperwork time frame but it has been beyond a week since her request. Pt says that forms are needed for work and she is unable to complete her assignment without paperwork. Please assist further.      [phone]- phone, pt would like a call when paperwork has been faxed.   Fax: [phone]

## 2017-07-29 NOTE — Telephone Encounter (Signed)
Spoke with pt to let her know that we have faxed the form but that we did not have documentation of her being allergic to the tdap vaccine so we could not put that on the form. The pt stated that she saw you back in 2009 and her very first appt with you was because she could not walk and was in a wheelchair after receiving the tdap vaccine. She stated that she was told then that she was allergic to the vaccine. Told the pt that she may have to call the office that you worked at in 2009 but the pt couldn't remember what office it was and she also state that she was told "when she transfer that she would have all of my records". Looked in the pt's chart and the last date I can find is 2013 and nothing in care everywhere.

## 2017-07-29 NOTE — Telephone Encounter (Signed)
I do not have those records, and I do not recall our first visit, so I cannot state that.

## 2017-07-30 NOTE — Telephone Encounter (Signed)
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

## 2017-07-30 NOTE — Telephone Encounter (Signed)
The practice has closed.  It was [facility] .

## 2017-07-30 NOTE — Telephone Encounter (Signed)
Pt would like the contact info for the practice Dr. 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    Pt CB#: [phone]

## 2017-08-04 NOTE — Telephone Encounter (Signed)
Attempted to call pt number given does not work. Other numbers in chart also do knot work.

## 2017-09-11 ENCOUNTER — Ambulatory Visit: Payer: PRIVATE HEALTH INSURANCE | Admitting: Internal Medicine

## 2017-09-11 DIAGNOSIS — Z0289 Encounter for other administrative examinations: Secondary | ICD-10-CM
